# Patient Record
Sex: Female | Born: 1955 | Race: White | Hispanic: No | Marital: Married | Smoking: Never smoker
Health system: Southern US, Community
[De-identification: ages and names within clinical notes are randomized; demographics above are authoritative.]

## PROBLEM LIST (undated history)

## (undated) DIAGNOSIS — M1990 Unspecified osteoarthritis, unspecified site: Secondary | ICD-10-CM

## (undated) DIAGNOSIS — E559 Vitamin D deficiency, unspecified: Secondary | ICD-10-CM

## (undated) DIAGNOSIS — M7660 Achilles tendinitis, unspecified leg: Secondary | ICD-10-CM

## (undated) DIAGNOSIS — E785 Hyperlipidemia, unspecified: Secondary | ICD-10-CM

## (undated) DIAGNOSIS — G43909 Migraine, unspecified, not intractable, without status migrainosus: Secondary | ICD-10-CM

## (undated) DIAGNOSIS — I10 Essential (primary) hypertension: Secondary | ICD-10-CM

## (undated) HISTORY — PX: LUMBAR SPINE SURGERY: SHX701

---

## 1998-05-14 ENCOUNTER — Ambulatory Visit (HOSPITAL_COMMUNITY): Admission: RE | Admit: 1998-05-14 | Discharge: 1998-05-14 | Payer: Self-pay | Admitting: *Deleted

## 1998-12-25 ENCOUNTER — Other Ambulatory Visit: Admission: RE | Admit: 1998-12-25 | Discharge: 1998-12-25 | Payer: Self-pay | Admitting: *Deleted

## 1999-02-28 ENCOUNTER — Encounter (INDEPENDENT_AMBULATORY_CARE_PROVIDER_SITE_OTHER): Payer: Self-pay

## 1999-02-28 ENCOUNTER — Other Ambulatory Visit: Admission: RE | Admit: 1999-02-28 | Discharge: 1999-02-28 | Payer: Self-pay | Admitting: *Deleted

## 2000-02-05 ENCOUNTER — Other Ambulatory Visit: Admission: RE | Admit: 2000-02-05 | Discharge: 2000-02-05 | Payer: Self-pay | Admitting: *Deleted

## 2000-08-13 ENCOUNTER — Emergency Department (HOSPITAL_COMMUNITY): Admission: EM | Admit: 2000-08-13 | Discharge: 2000-08-13 | Payer: Self-pay | Admitting: Emergency Medicine

## 2001-12-13 ENCOUNTER — Encounter: Payer: Self-pay | Admitting: *Deleted

## 2001-12-13 ENCOUNTER — Ambulatory Visit (HOSPITAL_COMMUNITY): Admission: RE | Admit: 2001-12-13 | Discharge: 2001-12-13 | Payer: Self-pay | Admitting: *Deleted

## 2002-03-20 ENCOUNTER — Other Ambulatory Visit: Admission: RE | Admit: 2002-03-20 | Discharge: 2002-03-20 | Payer: Self-pay | Admitting: *Deleted

## 2002-12-26 ENCOUNTER — Ambulatory Visit (HOSPITAL_COMMUNITY): Admission: RE | Admit: 2002-12-26 | Discharge: 2002-12-26 | Payer: Self-pay | Admitting: *Deleted

## 2002-12-26 ENCOUNTER — Encounter: Payer: Self-pay | Admitting: *Deleted

## 2003-05-28 ENCOUNTER — Other Ambulatory Visit: Admission: RE | Admit: 2003-05-28 | Discharge: 2003-05-28 | Payer: Self-pay | Admitting: *Deleted

## 2005-07-01 ENCOUNTER — Emergency Department (HOSPITAL_COMMUNITY): Admission: EM | Admit: 2005-07-01 | Discharge: 2005-07-01 | Payer: Self-pay | Admitting: Emergency Medicine

## 2005-09-14 ENCOUNTER — Emergency Department (HOSPITAL_COMMUNITY): Admission: EM | Admit: 2005-09-14 | Discharge: 2005-09-14 | Payer: Self-pay | Admitting: Family Medicine

## 2005-10-01 ENCOUNTER — Encounter: Admission: RE | Admit: 2005-10-01 | Discharge: 2005-10-01 | Payer: Self-pay | Admitting: *Deleted

## 2005-11-28 ENCOUNTER — Emergency Department (HOSPITAL_COMMUNITY): Admission: EM | Admit: 2005-11-28 | Discharge: 2005-11-28 | Payer: Self-pay | Admitting: Family Medicine

## 2006-03-24 ENCOUNTER — Other Ambulatory Visit: Admission: RE | Admit: 2006-03-24 | Discharge: 2006-03-24 | Payer: Self-pay | Admitting: *Deleted

## 2006-09-26 ENCOUNTER — Inpatient Hospital Stay (HOSPITAL_COMMUNITY): Admission: AD | Admit: 2006-09-26 | Discharge: 2006-09-26 | Payer: Self-pay | Admitting: *Deleted

## 2006-12-15 IMAGING — CT CT ANGIO CHEST
2 of 4 series · 19 of 36 positions shown · IV contrast (APPLIED)
Comparison: None.

CLINICAL DATA: Chest pain and shortness of breath.

CT ANGIOGRAPHY OF CHEST - PULMONARY EMBOLISM PROTOCOL
TECHNIQUE: Multidetector CT imaging of the chest was performed according to the
protocol for detection of pulmonary embolism during bolus injection of
intravenous contrast.  Coronal and sagittal plane CT angiographic image
reconstructions were also generated.
Contrast:  80 cc Omnipaque 300

[Series 5: pe 1.0 b20f st · axial · 0.63mm/px · z∈[-265,-41]mm · 16 of 250 slices shown]
[im 13/250  lung]
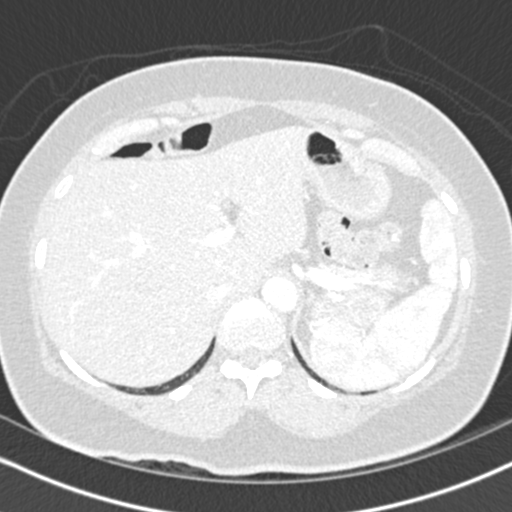
[im 25/250  mediastinal]
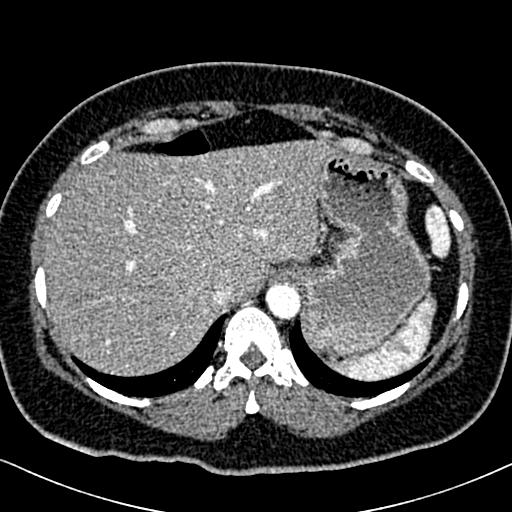
[im 38/250  lung]
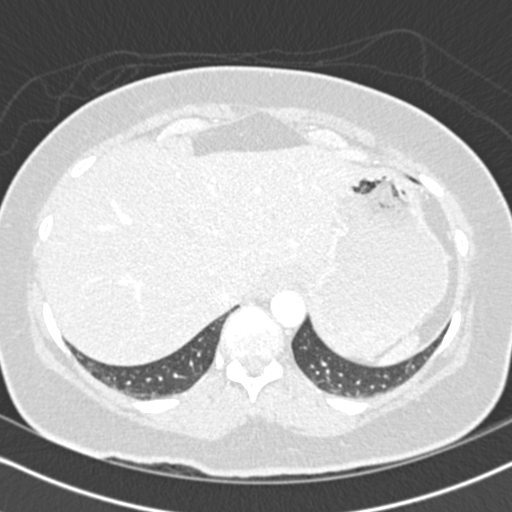
[im 63/250  mediastinal]
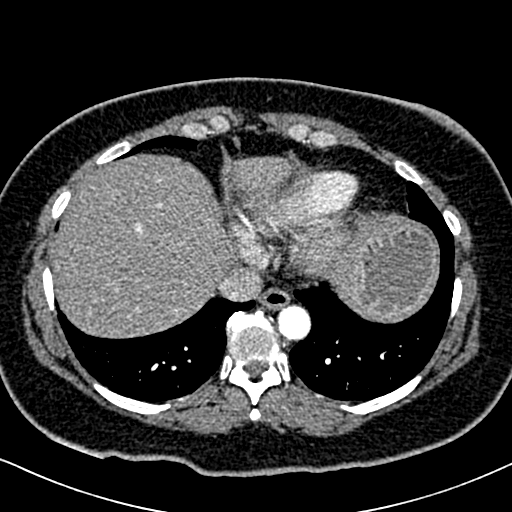
[im 75/250  lung]
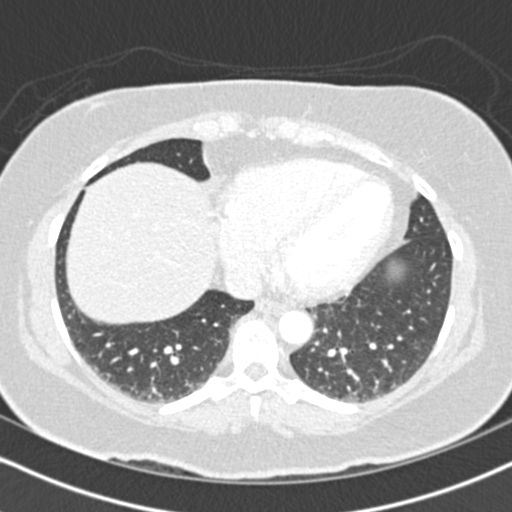
[im 88/250  mediastinal]
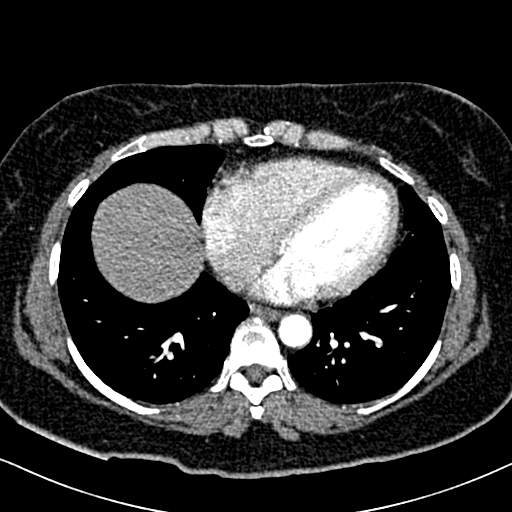
[im 100/250  lung]
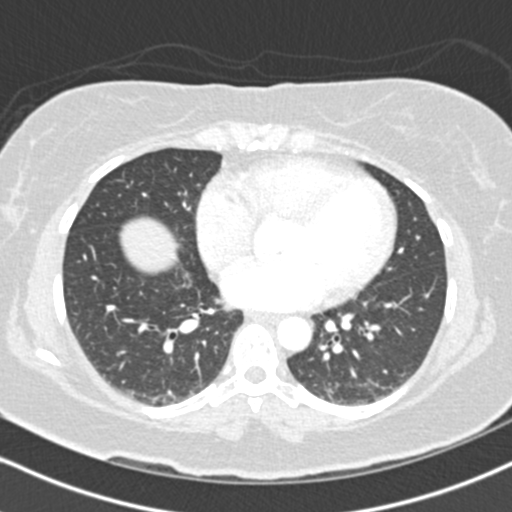
[im 113/250  mediastinal]
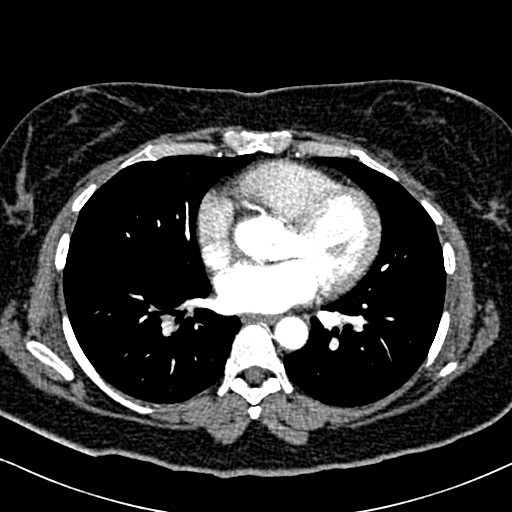
[im 137/250  lung]
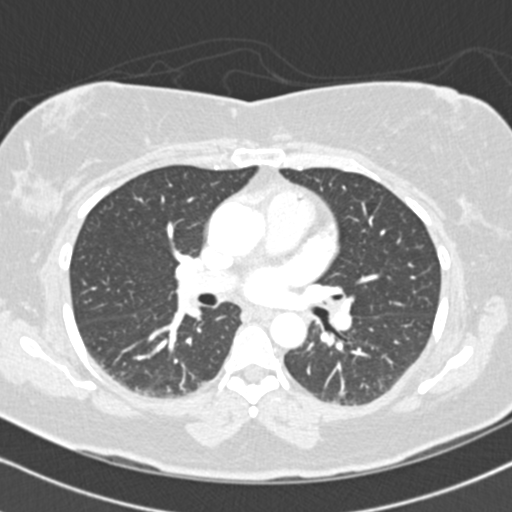
[im 150/250  mediastinal]
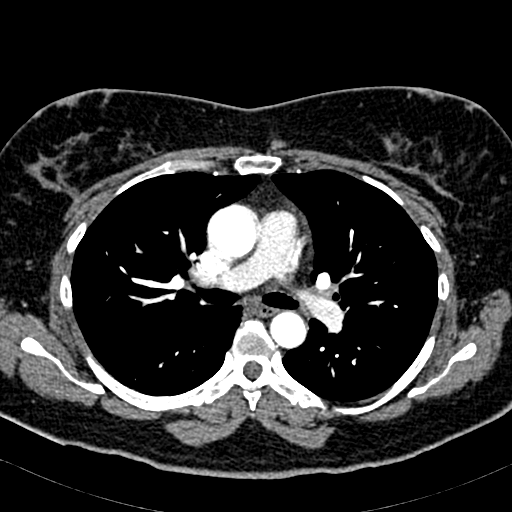
[im 162/250  lung]
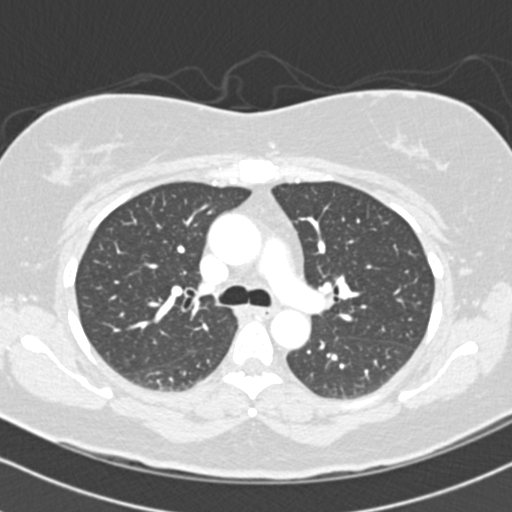
[im 175/250  mediastinal]
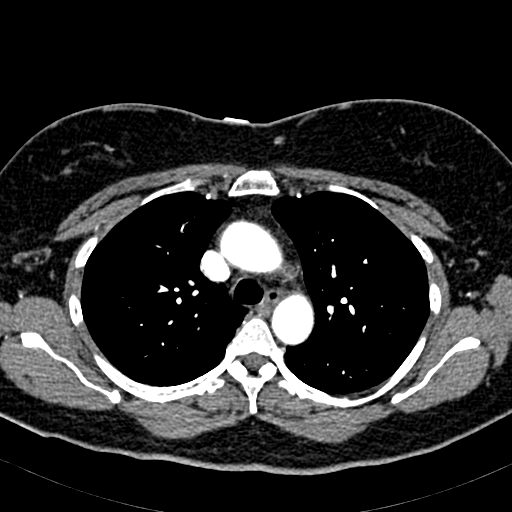
[im 187/250  lung]
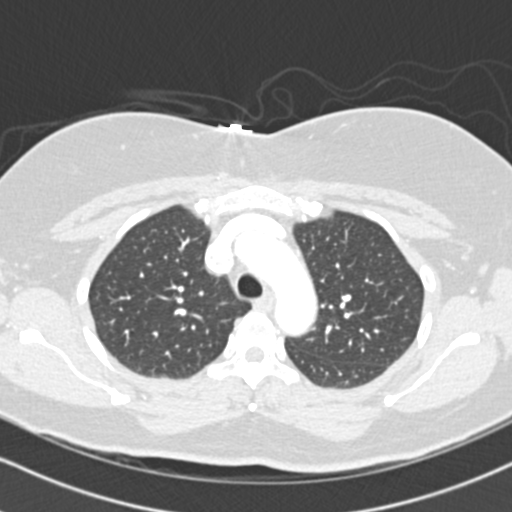
[im 212/250  mediastinal]
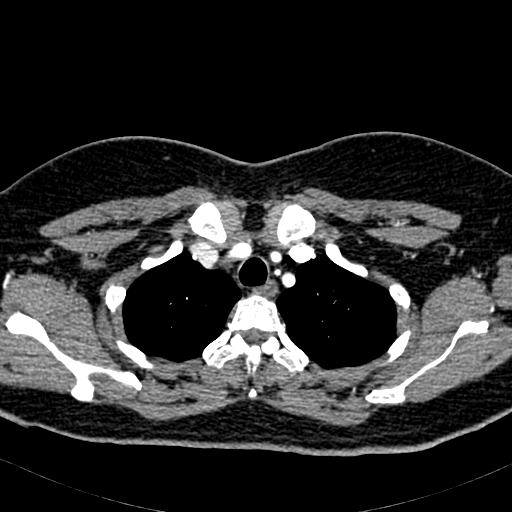
[im 225/250  lung]
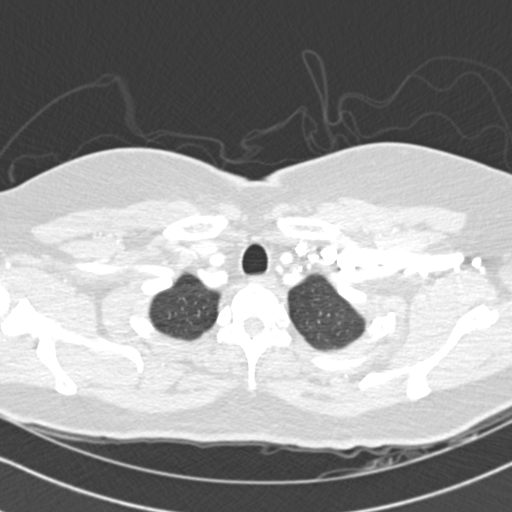
[im 237/250  mediastinal]
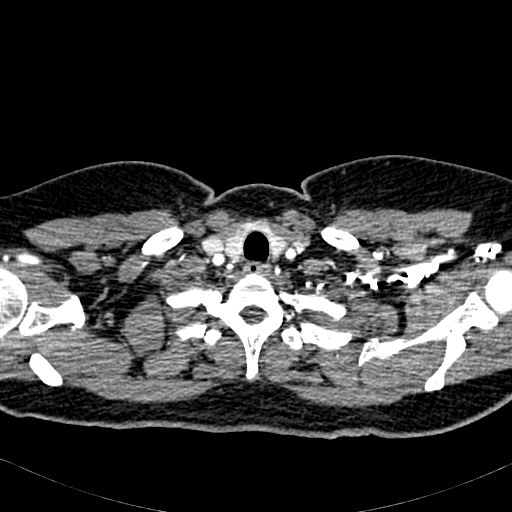

[Series 602: coronal · coronal · 0.63mm/px · 3 of 39 slices shown]
[im 8/39  mediastinal]
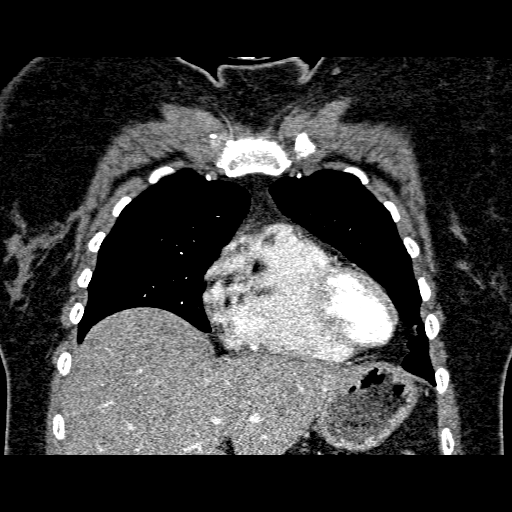
[im 16/39  mediastinal]
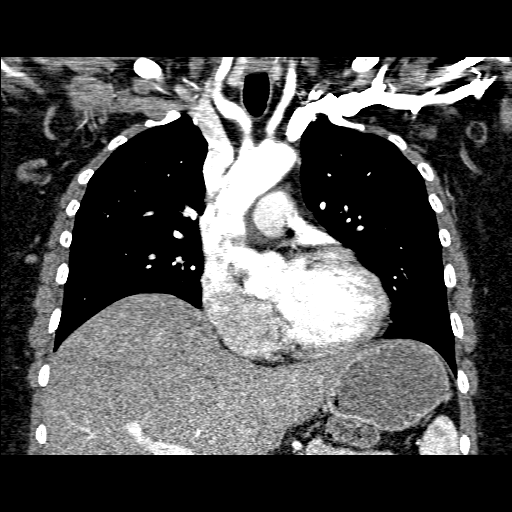
[im 23/39  mediastinal]
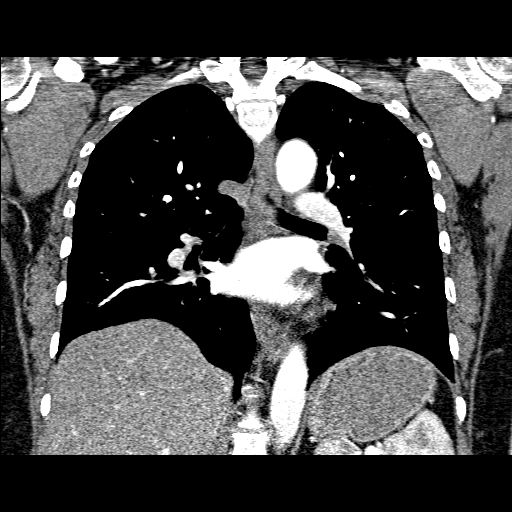

[19 of 36 positions shown; findings below may reference images not displayed]

FINDINGS: Suboptimally opacified pulmonary arteries with no pulmonary arterial
filling defects seen. Clear lungs. No enlarged lymph nodes. No pneumothorax or
pleural fluid. Lower thoracic spine degenerative changes.

IMPRESSION

No acute abnormality. No pulmonary emboli seen.

## 2007-04-20 ENCOUNTER — Other Ambulatory Visit: Admission: RE | Admit: 2007-04-20 | Discharge: 2007-04-20 | Payer: Self-pay | Admitting: *Deleted

## 2007-05-18 ENCOUNTER — Emergency Department (HOSPITAL_COMMUNITY): Admission: EM | Admit: 2007-05-18 | Discharge: 2007-05-18 | Payer: Self-pay | Admitting: Emergency Medicine

## 2007-05-31 ENCOUNTER — Emergency Department (HOSPITAL_COMMUNITY): Admission: EM | Admit: 2007-05-31 | Discharge: 2007-05-31 | Payer: Self-pay | Admitting: Emergency Medicine

## 2007-07-11 ENCOUNTER — Other Ambulatory Visit: Admission: RE | Admit: 2007-07-11 | Discharge: 2007-07-11 | Payer: Self-pay | Admitting: *Deleted

## 2008-01-12 ENCOUNTER — Other Ambulatory Visit: Admission: RE | Admit: 2008-01-12 | Discharge: 2008-01-12 | Payer: Self-pay | Admitting: Gynecology

## 2008-05-29 ENCOUNTER — Ambulatory Visit (HOSPITAL_COMMUNITY): Admission: RE | Admit: 2008-05-29 | Discharge: 2008-05-29 | Payer: Self-pay | Admitting: Gynecology

## 2008-06-01 ENCOUNTER — Other Ambulatory Visit: Admission: RE | Admit: 2008-06-01 | Discharge: 2008-06-01 | Payer: Self-pay | Admitting: Gynecology

## 2008-11-15 ENCOUNTER — Emergency Department (HOSPITAL_COMMUNITY): Admission: EM | Admit: 2008-11-15 | Discharge: 2008-11-15 | Payer: Self-pay | Admitting: Family Medicine

## 2010-01-28 ENCOUNTER — Ambulatory Visit: Payer: Self-pay | Admitting: Family Medicine

## 2010-01-28 DIAGNOSIS — M722 Plantar fascial fibromatosis: Secondary | ICD-10-CM | POA: Insufficient documentation

## 2010-01-28 DIAGNOSIS — M7660 Achilles tendinitis, unspecified leg: Secondary | ICD-10-CM | POA: Insufficient documentation

## 2010-01-28 DIAGNOSIS — E785 Hyperlipidemia, unspecified: Secondary | ICD-10-CM | POA: Insufficient documentation

## 2010-01-28 DIAGNOSIS — I10 Essential (primary) hypertension: Secondary | ICD-10-CM | POA: Insufficient documentation

## 2010-04-02 ENCOUNTER — Encounter: Admission: RE | Admit: 2010-04-02 | Discharge: 2010-05-12 | Payer: Self-pay | Admitting: Emergency Medicine

## 2010-04-18 ENCOUNTER — Encounter: Payer: Self-pay | Admitting: Emergency Medicine

## 2010-04-23 ENCOUNTER — Encounter: Admission: RE | Admit: 2010-04-23 | Discharge: 2010-04-23 | Payer: Self-pay | Admitting: Legal Medicine

## 2010-05-08 ENCOUNTER — Ambulatory Visit: Payer: Self-pay | Admitting: Sports Medicine

## 2010-05-08 DIAGNOSIS — M79609 Pain in unspecified limb: Secondary | ICD-10-CM | POA: Insufficient documentation

## 2010-06-05 ENCOUNTER — Ambulatory Visit: Payer: Self-pay | Admitting: Sports Medicine

## 2010-06-05 DIAGNOSIS — M2550 Pain in unspecified joint: Secondary | ICD-10-CM | POA: Insufficient documentation

## 2010-06-11 ENCOUNTER — Encounter: Payer: Self-pay | Admitting: Sports Medicine

## 2010-06-13 ENCOUNTER — Encounter: Payer: Self-pay | Admitting: Emergency Medicine

## 2010-07-09 ENCOUNTER — Encounter: Payer: Self-pay | Admitting: Family Medicine

## 2010-07-09 ENCOUNTER — Ambulatory Visit: Payer: Self-pay | Admitting: Sports Medicine

## 2010-07-23 ENCOUNTER — Ambulatory Visit: Payer: Self-pay | Admitting: Family Medicine

## 2010-08-31 ENCOUNTER — Encounter: Payer: Self-pay | Admitting: *Deleted

## 2010-08-31 ENCOUNTER — Encounter: Payer: Self-pay | Admitting: Legal Medicine

## 2010-09-11 NOTE — Assessment & Plan Note (Signed)
Summary: F/U ARCH,MC   Vital Signs:  Patient profile:   55 year old female BP sitting:   138 / 73  Vitals Entered By: Lillia Pauls CMA (June 05, 2010 9:37 AM)  History of Present Illness: 55yo female to office for f/u b/l heel pain. Pain relatively unchanged from last visit, predominantly in the achilles, but also along PF. Symptoms worse on the right. Has tried using sports insoles with heel lifts, but do not seem to help.  Feels most comfortable when wearing her Dansco's at work. Cont. to wear arch strap on right which does help. Apply ice periodically. Doing heel raise exercises & other exercises 1-2 times/daily. Completed formal physical therapy several weeks ago. Not taking anything for the pain at this time. Feels she is limping at work.  Also c/o achiness & occasional swelling in her hands, wrists, and other joints.  Recalls having similar symptoms in the past while taking simvastatin, this was stopped & symptoms improved.  She is now taking Crestor & has been on this for over 2-years. No known Family Hx of RA, althought mother passed away when pt was young.  Pt's PCP has done blood work & sent her to a rheumatologist for concerns of RA.  Rheumatologist did not find anything per patient.  Allergies: No Known Drug Allergies  Review of Systems      See HPI  Physical Exam  General:  Well-developed,well-nourished,in no acute distress; alert,appropriate and cooperative throughout examination Msk:  ANKLES:  full ROM b/l without pain.  no swelling, bruising, erythema.  Normal ankle strength b/l.  TTP along achilles b/l, no palpable nodules or step-offs.  Normal Thompson test b/l.  Able to do heel raises.  Foot: On standing, flattening of transverse arch R>L with mild flattening of longitudinal arch bilaterally.  No significant callous formation.  GAIT:  walks with right foot externally rotated, seems to favor right leg.  Does have some dynamic pronation of both feet as  well.  UPPER EXT:  normal ROM of hands, wrists, elbows.  No TTP over hands, wrists, elbows b/l.  No swelling or erythema.  Normal upper ext strength b/l. Pulses:  +2/4 radial, DP, PT b/l Neurologic:  sensation intact to light touch.   Additional Exam:  MSK U/S:  bilateraly achilles width normal, no increased doppler flow, no signs of inflammation.  Normal appearing PF bilaterally with normal width, no signs of inflammation or spurring.  No increased dopper flow.  Images saved.   Impression & Recommendations:  Problem # 1:  FOOT PAIN, BILATERAL (ICD-729.5) - Normal appearing achilles & PF on u/s today without any signs of inflammation.  Question whether pain related to her foot breakdown vs possible side effect from Crestor vs other process such as RA or other rheumatologic disorder - Recommend she stop her Crestor for the next month - Lab slip given for CBC, ESR, RF, ANA - plans to get this done where she works - Cont. to wear sports insoles & good supportive shoes.  Could consider custom orthotics in the future if symptoms persist & other workup negative. - Cont. exercises - f/u in 36-month for re-evaluation  Future Orders: CBC-[facility] ([id number]) ... 05/18/2012  Problem # 2:  ARTHRALGIA (ICD-719.40) - Multiple arthralgias of upper & lower extremities. - Stop Crestor as stated above since had similar symptoms in past with simvastatin - Check blood work as stated above to evaluate for rheumatologic process  Orders: [place] LIMITED ([id number])Future Orders: CBC-[facility] ([id number]) ... 05/18/2012 ANA-[facility] ([id number]) .Marland KitchenMarland Kitchen  05/20/2011 Rheum Fact-[facility] (95638) ... 05/12/2011 Sed Rate (ESR)-[facility] [phone]) ... 05/11/2011  Problem # 3:  HAND PAIN, BILATERAL (ICD-729.5) - Possible medication side effect vs rheumatologic process - Stop Crestor as stated above  - Check blood work as stated above   Orders: [place] LIMITED ([id number])Future Orders: CBC-[facility] (32951) ... 05/18/2012 ANA-[facility] ([id number]) ... 05/20/2011 Rheum  Fact-[facility] (16010) ... 05/12/2011 Sed Rate (ESR)-[facility] [phone]) ... 05/11/2011  Complete Medication List: 1)  Cymbalta 60 Mg Cpep (Duloxetine hcl) .... Once daily 2)  Topamax 50 Mg Tabs (Topiramate) .... Once daily 3)  Xanax 1 Mg Tabs (Alprazolam) .... Bid 4)  Crestor 10 Mg Tabs (Rosuvastatin calcium) .... Once daily 5)  Phentermine Hcl 37.5 Mg Caps (Phentermine hcl) .... Once daily 6)  Aspirin 81 Mg Tbec (Aspirin) .... Once daily 7)  Multivitamins Tabs (Multiple vitamin) .... Once daily  Patient Instructions: 1)  You had a normal ultrasound of your plantar fascia & achilles today. 2)  Try stopping your crestor for 42-month & see if this helps since had similar effects from simvastatin in past. 3)  Will check blood work - CBC, ESR, ANA, RF to evaluate for possibility of rheumatoid or other disease process. 4)  Wear good supportive shoes, wear arch straps as needed. 5)  f/u in 65-month.   Orders Added: 1)  CBC-[facility] [[id number]] 2)  ANA-[facility] [[id number]] 3)  Rheum Fact-[facility] [86430] 4)  Sed Rate (ESR)-[facility] [85651] 5)  Est. Patient Level IV [70623] 6)  [place] LIMITED [[id number]]  Appended Document: F/U ARCH,MC Spoke to pt on phone 06/11/10 @ 13:40 regarding recent blood test results from 06/05/10.  Informed her CBC, ESR, ANA normal, but had significantly elevated RF = 3140. She continues to have pain in her feet, as well as hands.  Will check additional labs including: Anti-ccp to further evaluate significance of elevated RF.  New lab ordered faxed to [facility] @ [phone] for patient, she will get blood drawn there.  Will contact her with results & discuss further tx & f/u at that time.  Pt expressed understanding & agreement with above.  Appended Document: F/U ARCH,MC Spoke to pt on phone 06/13/10.  Follow-up blood work with anti-CCP was in the normal ranges.  At this time suspect she has some rheumatologic process, but does not seem to be rheumatoid arthritis at this time.  No further blood work  at this time.  Will try to determine best treatment plan & get in touch with her next week.  Pt expressed understanding & agreement with above & thanked me for the call.  she can be reached at work [phone] or at home phone number on chart.

## 2010-09-11 NOTE — Letter (Signed)
Summary: *Consult Note  [facility]  [address]   [place], [place] [address]   Phone: [phone]  Fax: [phone]    Re:    Sabrina Thomas DOB:    13-Sep-1955   Dear: Ahmed Prima   Thank you for requesting that we see the above patient for consultation.  A copy of the detailed office note will be sent under separate cover, for your review.  Evaluation today is consistent with: resolved foot/heel pain.  Given history of multiple other joints involved with pain over time and her rheum factor of 3100, she likely does have some developing RA.  You may not have requested this consult, we are merely providing this to you for your information.   Our recommendation is for: Mobic 7.5 mg once daily to two times a day.  Also do trial of vitamin C daily and fish oil 1500 mg daily.  encouraged to stay as active as possible.  Because she is improved off crestor, would recommend she stay off of it at this time and try other dietary changes for her cholesterol.  See [place] back in 6 weeks.  New Medications started today include: mobic 7.5 mg 1-2 times daily   After today's visit, the patients current medications include: 1)  CYMBALTA 60 MG CPEP (DULOXETINE HCL) once daily 2)  TOPAMAX 50 MG TABS (TOPIRAMATE) once daily 3)  XANAX 1 MG TABS (ALPRAZOLAM) bid 4)  CRESTOR 10 MG TABS (ROSUVASTATIN CALCIUM) once daily 5)  PHENTERMINE HCL 37.5 MG CAPS (PHENTERMINE HCL) once daily 6)  ASPIRIN 81 MG TBEC (ASPIRIN) once daily 7)  MULTIVITAMINS  TABS (MULTIPLE VITAMIN) once daily 8)  MELOXICAM 7.5 MG TABS (MELOXICAM) take 1-2 times daily   Thank you for this consultation.  If you have any further questions regarding the care of this patient, please do not hesitate to contact me @ [phone]  Thank you for this opportunity to look after your patient.  Sincerely,  Corbin Ade MD Sports Medicine Fellow  [facility] Director

## 2010-09-11 NOTE — Letter (Signed)
Summary: PHYSICAL THERAPY PROGRESS NOTE  PHYSICAL THERAPY PROGRESS NOTE   Imported By: Dannette Barbara 04/18/2010 17:07:58  _____________________________________________________________________  External Attachment:    Type:   Image     Comment:   External Document

## 2010-09-11 NOTE — Assessment & Plan Note (Signed)
Summary: NP,ACHILLIES TENDON,MC   Vital Signs:  Patient profile:   55 year old female Height:      64 inches Weight:      160 pounds BMI:     27.56 BP sitting:   142 / 89  Vitals Entered By: Lillia Pauls CMA (May 08, 2010 10:09 AM)  History of Present Illness: 55 yo woman with a 3 month history of heel and foot pain. Patient moved in April and increased her activity, particularly going up and down stairs over a six week period. In June, she was getting out of her car and found that she could not walk because of pain in both feet and achilles.  She went to urgent care and was advised to take Alieve and start stretching. She saw Dr. Orson Aloe who prescribed physical therapy, and Dr. Lajoyce Corners who gave her one cortisone injection in the plantar surface of each heel. The injections helped with pain relief for about a week. At follow up, Dr. Lajoyce Corners suggested that if she was not improving she might need surgery. She came here for a second opinion regarding conservative management options.  In the office today, her pain and mobility are improved and she is able to walk, though still with some discomfort. She has been wearing an aircast with achilles compression on her R leg, which causes her more pain.  Allergies (verified): No Known Drug Allergies  Physical Exam  General:  Well-developed,well-nourished,in no acute distress; alert,appropriate and cooperative throughout examination Msk:  Ankle: Symmetric appearance of ankles without edema, erythema. No pain to palpation along achilles or heels.  Foot: On standing, flattening of transverse arch R>L with some evidence of flattening of longitudinal arch bilaterally. Flexion and extension of great toe on R reveals decreased extension to 30 degrees and flexion to 10 degrees. Flexion and extension of great toe on L reveals decreased extension to 45 degrees and flexion to 15 degrees.  Ultrasound: Bilateral achilles width WNL. Bilateral plantar fascia  width WNL. Great toe with bone spur in MTP joint.  There are no tears noted in either tissue. No abnormal doppler activity.    Impression & Recommendations:  Problem # 1:  FOOT PAIN, BILATERAL (ICD-729.5)  Bilateral foot pain likely secondary to increased physical activity and foot breakdown given normal width of plantar fascia on ultrasound. Achilles also wnl. Patient instructed to continue physical therapy and daily icing. Patient tried temporary insoles with heel lifts bilaterally, arch support on R. Reported improved pain in office. She will return in 4 weeks for custom orthotics.   do eccentric exercise protocol  Orders: [place] LIMITED ([id number])  Complete Medication List: 1)  Cymbalta 60 Mg Cpep (Duloxetine hcl) .... Once daily 2)  Topamax 50 Mg Tabs (Topiramate) .... Once daily 3)  Xanax 1 Mg Tabs (Alprazolam) .... Bid 4)  Crestor 10 Mg Tabs (Rosuvastatin calcium) .... Once daily 5)  Phentermine Hcl 37.5 Mg Caps (Phentermine hcl) .... Once daily 6)  Aspirin 81 Mg Tbec (Aspirin) .... Once daily 7)  Multivitamins Tabs (Multiple vitamin) .... Once daily

## 2010-09-11 NOTE — Assessment & Plan Note (Signed)
Summary: FEET PAIN   Vital Signs:  Patient Profile:   55 Years Old Female CC:      bilateral foot pain X 3 weeks Height:     63 inches Weight:      169 pounds O2 Sat:      96 % O2 treatment:    Room Air Temp:     98.9 degrees F oral Pulse rate:   86 / minute Resp:     12 per minute BP sitting:   141 / 91  (right arm) Cuff size:   regular  Pt. in pain?   yes    Location:   bilateral feet    Intensity:   10    Type:       heaviness/dull  Vitals Entered By: Lajean Saver RN (January 28, 2010 3:37 PM)                   Updated Prior Medication List: CYMBALTA 60 MG CPEP (DULOXETINE HCL) once daily TOPAMAX 50 MG TABS (TOPIRAMATE) once daily XANAX 1 MG TABS (ALPRAZOLAM) bid CRESTOR 10 MG TABS (ROSUVASTATIN CALCIUM) once daily PHENTERMINE HCL 37.5 MG CAPS (PHENTERMINE HCL) once daily ASPIRIN 81 MG TBEC (ASPIRIN) once daily MULTIVITAMINS  TABS (MULTIPLE VITAMIN) once daily  Current Allergies: No known allergies History of Present Illness Chief Complaint: bilateral foot pain X 3 weeks History of Present Illness: Subjective:  Patient complains of bilateral foot and heel pain for several weeks, now worse for 2 to 3 days.  Recalls no recent trauma or significant change in physical activities.  She tends to wear flat sandals.    REVIEW OF SYSTEMS Constitutional Symptoms      Denies fever, chills, night sweats, weight loss, weight gain, and fatigue.  Eyes       Denies change in vision, eye pain, eye discharge, glasses, contact lenses, and eye surgery. Ear/Nose/Throat/Mouth       Denies hearing loss/aids, change in hearing, ear pain, ear discharge, dizziness, frequent runny nose, frequent nose bleeds, sinus problems, sore throat, hoarseness, and tooth pain or bleeding.  Respiratory       Denies dry cough, productive cough, wheezing, shortness of breath, asthma, bronchitis, and emphysema/COPD.  Cardiovascular       Denies murmurs, chest pain, and tires easily with exhertion.     Gastrointestinal       Denies stomach pain, nausea/vomiting, diarrhea, constipation, blood in bowel movements, and indigestion. Genitourniary       Denies painful urination, kidney stones, and loss of urinary control. Neurological       Denies paralysis, seizures, and fainting/blackouts. Musculoskeletal       Complains of muscle pain and joint pain.      Denies joint stiffness, decreased range of motion, redness, swelling, muscle weakness, and gout.      Comments: feet Skin       Denies bruising, unusual mles/lumps or sores, and hair/skin or nail changes.  Psych       Denies mood changes, temper/anger issues, anxiety/stress, speech problems, depression, and sleep problems. Other Comments: intermittent foot pain X 3 weeks, very painful to walk   Past History:  Past Medical History: Hyperlipidemia Hypertension- past  Past Surgical History: Tubal ligation L4 ruputre disc repair  Family History: NA  Social History: Occupation: front office [employer] Never Smoked Alcohol use-no Drug use-no Regular exercise-yes Smoking Status:  never Drug Use:  no Does Patient Exercise:  yes   Objective:  Left foot:  No swelling, erythema,  or warmth.  Mild tenderness in forefoot.  Tenderness over plantar surface towards heel.  Distinct tenderness over insertion of achilles tendon at heel.  Distal neurovascular intact  Right foot:  No swelling, erythema, or warmth.  Mild tenderness in forefoot.  Tenderness over plantar surface at heel.  Distal neurovascular intact  Assessment New Problems: PLANTAR FASCIITIS, BILATERAL (ICD-728.71) ACHILLES TENDINITIS (ICD-726.71) HYPERTENSION (ICD-401.9) HYPERLIPIDEMIA (ICD-272.4)   Plan New Orders: New Patient Level III [99203] Planning Comments:   Begin NSAID for several days.  Begin using well-fitting athletic shoes, possibly with a custom insert such as "SuperFeet." Begin stretching exercises ([employer] information and instruction patient  handout given)  Follow-up with [facility] if not improving two to three weeks.   The patient and/or caregiver has been counseled thoroughly with regard to medications prescribed including dosage, schedule, interactions, rationale for use, and possible side effects and they verbalize understanding.  Diagnoses and expected course of recovery discussed and will return if not improved as expected or if the condition worsens. Patient and/or caregiver verbalized understanding.   Orders Added: 1)  New Patient Level III [[address]]

## 2010-09-11 NOTE — Assessment & Plan Note (Signed)
Summary: f/u,mc   Vital Signs:  Patient profile:   55 year old female BP sitting:   131 / 82  Vitals Entered By: Lillia Pauls CMA (July 09, 2010 9:33 AM)  History of Present Illness: pt here today to fu her B heel pain, which she sts is about 80% better with the exercises. the pain didnt really subside until about 10days ago. was able to stand up for "5 thanksgiving dinners" without any pain.  Prior to this had horrible 2 week time period. Not really using her green insoles.  Only feels good in her danskos.  No longer has 1st step pain when getting out of bed.  Only occasionally has bad pain with 1st moving now. Has stopped Crestor since last visit. In past has had pain in b/l wrists, MCPs, neck, shoulders, hips, and feet/toes, in addition to her heels.  However, at present is basically pain free. Unclear if family hx of RA or other rheum diseases. Lab review showed RF of 3100, otherwise neg.  Anti-CCP nl. Denies h/o IVDA.  Denies possibility of chronic HBV or HCV. Prev saw rheum doctor who did not thinks she had RA.  Allergies: No Known Drug Allergies  Physical Exam  General:  Well-developed,well-nourished,in no acute distress; alert,appropriate and cooperative throughout examination Head:  normocephalic.   Neck:  supple.   Lungs:  normal respiratory effort.   Abdomen:  soft.   Msk:  Hands: some mild swelling/synovitis of b/l diffuse MCP joints.  No ulnar deviation yet of wrist or PIPs. + squaring of b/l CMC joints.  Feet: no ttp over AT or PF insertion b/l.  No RC bursitis.  Nl foot ROM.  Neck: supple, FROM Neurologic:  alert & oriented X3.     Impression & Recommendations:  Problem # 1:  FOOT PAIN, BILATERAL (ICD-729.5) At this point, greatly improved.  Given h/o other complaints, more likely from systemic disease process than focal PF or AT. - continue Danskos and heel stretches - f/u 6 weeks - trial of NSAID as below  Problem # 2:  ARTHRALGIA  (ICD-719.40) Essentially pain free every where.  however, with labwork concerning that she does have a developing rheum disease. - see pt instructions - trial of NSAID, vit C, and fish oil - symptom diary - f/u 6 weeks  Complete Medication List: 1)  Cymbalta 60 Mg Cpep (Duloxetine hcl) .... Once daily 2)  Topamax 50 Mg Tabs (Topiramate) .... Once daily 3)  Xanax 1 Mg Tabs (Alprazolam) .... Bid 4)  Crestor 10 Mg Tabs (Rosuvastatin calcium) .... Once daily 5)  Phentermine Hcl 37.5 Mg Caps (Phentermine hcl) .... Once daily 6)  Aspirin 81 Mg Tbec (Aspirin) .... Once daily 7)  Multivitamins Tabs (Multiple vitamin) .... Once daily 8)  Meloxicam 7.5 Mg Tabs (Meloxicam) .... Take 1-2 times daily  Patient Instructions: 1)  Keep log of your pains in a diary. 2)  Try the meloxicam 7.5 mg daily, and ok to increase to twice daily if helpful with pain. 3)  Follow up in 6 weeks. 4)  Continue vitamin C daily. 5)  Try fish oil 1500 mg daily. 6)  I would recommend staying off the crestor. 7)  Try eating soy products and almonds for your cholesterol. Prescriptions: MELOXICAM 7.5 MG TABS (MELOXICAM) take 1-2 times daily  #60 x 1   Entered and Authorized by:   Corbin Ade MD   Signed by:   Corbin Ade MD on 07/09/2010   Method used:   Electronically  to        [facility] (retail)       [address]       [place], [place]  [address]       Ph: [phone]       Fax: [phone]   RxID:   [id number]    Orders Added: 1)  Est. Patient Level III [44010]

## 2010-09-23 ENCOUNTER — Encounter: Payer: Self-pay | Admitting: Sports Medicine

## 2010-09-23 ENCOUNTER — Ambulatory Visit (INDEPENDENT_AMBULATORY_CARE_PROVIDER_SITE_OTHER): Payer: 59 | Admitting: Sports Medicine

## 2010-09-23 DIAGNOSIS — M2550 Pain in unspecified joint: Secondary | ICD-10-CM

## 2010-10-01 NOTE — Assessment & Plan Note (Signed)
Summary: F/U,MC   Vital Signs:  Patient profile:   55 year old female BP sitting:   141 / 89  Vitals Entered By: Lillia Pauls CMA (September 23, 2010 9:33 AM)  History of Present Illness: Now has pain in heels only if she overdoes activity no other joints meloxicam - maybe some GI effects but PCP thinks bleeding is from int hemmrhoid   danskos help nothing else has made much difference x not walking barefoot  no real evidence that the crestor or other meds have affected this. 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 Once daily 2)  Topamax 50 Mg Tabs (Topiramate) .... Once daily 3)  Xanax 1 Mg Tabs (Alprazolam) .... Bid 4)  Crestor 10 Mg Tabs (Rosuvastatin calcium) .... Once daily 5)  Aspirin 81 Mg Tbec (Aspirin) .... Once daily 6)  Multivitamins Tabs (Multiple vitamin) .... Once daily 7)  Meloxicam 7.5 Mg Tabs (Meloxicam) .... Take 1-2 times daily  Patient Instructions: 1)  Trial on meloxicam every other day 2)  If OK you can even shift to every third day after 1 to 2 months 3)  OK to try exercise bike 4)  OK to walk but keep time  at 30 mins and the frequency every other day 5)  barefoot will trigger pain in achilles so use some heel support 6)  check with me in 6 months Prescriptions: MELOXICAM 7.5 MG TABS (MELOXICAM) take 1-2 times daily  #180 x 3   Entered and Authorized by:   Enid Baas MD   Signed by:   Enid Baas MD on 09/23/2010   Method used:   Electronically to        [facility]* (retail)       [address]       [address] Shipping/mailing       [place], [place]  [address]       Ph: [phone]       Fax: [phone]   RxID:   [id number]    Orders Added: 1)  Est. Patient Level III [84132]

## 2010-10-07 ENCOUNTER — Telehealth (INDEPENDENT_AMBULATORY_CARE_PROVIDER_SITE_OTHER): Payer: Self-pay | Admitting: *Deleted

## 2010-10-16 NOTE — Progress Notes (Signed)
  Pt states she started crestor back 1 week ago- started having joint pain in hips, knees, shoulders, hands 4 days ago.  States pain is 8 out of 10.  Per Dr. Darrick Penna- stop crestor- take short burst of prednisone called to [facility] [place]- [phone],   ---- Converted from flag ---- ---- 10/07/2010 2:05 PM, Marily Memos wrote: Pt called regarding the pain she is in. her contact # is [phone] ------------------------------       New/Updated Medications: PREDNISONE 20 MG TABS (PREDNISONE) take 1 by mouth two times a day Prescriptions: PREDNISONE 20 MG TABS (PREDNISONE) take 1 by mouth two times a day  #20 x 0   Entered by:   Rochele Pages RN   Authorized by:   Enid Baas MD   Signed by:   Rochele Pages RN on 10/07/2010   Method used:   Electronically to        [facility] (retail)       [address]       [place], [place]  [address]       Ph: [phone]       Fax: [phone]   RxID:   [id number]

## 2010-11-18 ENCOUNTER — Ambulatory Visit: Payer: 59 | Admitting: Sports Medicine

## 2011-02-17 ENCOUNTER — Inpatient Hospital Stay (INDEPENDENT_AMBULATORY_CARE_PROVIDER_SITE_OTHER)
Admission: RE | Admit: 2011-02-17 | Discharge: 2011-02-17 | Disposition: A | Discharge status: Home / Self Care | Payer: Self-pay | Source: Ambulatory Visit | Attending: Emergency Medicine | Admitting: Emergency Medicine

## 2011-02-17 ENCOUNTER — Encounter: Payer: Self-pay | Admitting: Emergency Medicine

## 2011-02-17 DIAGNOSIS — T6391XA Toxic effect of contact with unspecified venomous animal, accidental (unintentional), initial encounter: Secondary | ICD-10-CM

## 2011-07-13 NOTE — Progress Notes (Signed)
Summary: RT HAND SWOLLEN/INFLAMMED/DIZZINESS rm 5   Vital Signs:  Patient Profile:   55 Years Old Female CC:      RT hand, no better Height:     64 inches O2 Sat:      97 % O2 treatment:    Room Air Temp:     98.3 degrees F oral Pulse rate:   70 / minute Resp:     16 per minute BP sitting:   156 / 83  (left arm) Cuff size:   regular  Vitals Entered By: Clemens Catholic LPN (February 17, 2011 5:42 PM)                  Updated Prior Medication List: CYMBALTA 60 MG CPEP (DULOXETINE HCL) once daily TOPAMAX 50 MG TABS (TOPIRAMATE) once daily XANAX 1 MG TABS (ALPRAZOLAM) bid CRESTOR 10 MG TABS (ROSUVASTATIN CALCIUM) once daily ASPIRIN 81 MG TBEC (ASPIRIN) once daily MULTIVITAMINS  TABS (MULTIPLE VITAMIN) once daily MELOXICAM 7.5 MG TABS (MELOXICAM) take 1-2 times daily  Current Allergies: No known allergies History of Present Illness History from: patient & fiance Chief Complaint: RT hand, no better History of Present Illness: Stung by wasp 2 days ago on her R hand.  Saw a doctor yesterday who put her on Keflex for possible celllulitis, advised to keep elevated, ice, and Benedryl. She isn't getting better yet and the swelling is worse.  She is R handed and works for [employer]. Pain is throbbing and worse with motion.  She is also having mild nausea but she doesn't know if that's from the sting or the Keflex.  No SOB or CP.  REVIEW OF SYSTEMS Constitutional Symptoms      Denies fever, chills, night sweats, weight loss, weight gain, and fatigue.  Eyes       Denies change in vision, eye pain, eye discharge, glasses, contact lenses, and eye surgery. Ear/Nose/Throat/Mouth       Denies hearing loss/aids, change in hearing, ear pain, ear discharge, dizziness, frequent runny nose, frequent nose bleeds, sinus problems, sore throat, hoarseness, and tooth pain or bleeding.  Respiratory       Denies dry cough, productive cough, wheezing, shortness of breath, asthma, bronchitis, and  emphysema/COPD.  Cardiovascular       Denies murmurs, chest pain, and tires easily with exhertion.    Gastrointestinal       Denies stomach pain, nausea/vomiting, diarrhea, constipation, blood in bowel movements, and indigestion.      Comments: nausea Genitourniary       Denies painful urination, kidney stones, and loss of urinary control. Neurological       Denies paralysis, seizures, and fainting/blackouts. Musculoskeletal       Complains of redness and swelling.      Denies muscle pain, joint pain, joint stiffness, decreased range of motion, muscle weakness, and gout.  Skin       Denies bruising, unusual mles/lumps or sores, and hair/skin or nail changes.  Psych       Denies mood changes, temper/anger issues, anxiety/stress, speech problems, depression, and sleep problems. Other Comments: pt states that her RT hand is no better, after a bee sting on 02/15/11.    Past History:  Past Medical History: Reviewed history from 01/28/2010 and no changes required. Hyperlipidemia Hypertension- past  Past Surgical History: Reviewed history from 01/28/2010 and no changes required. Tubal ligation L4 ruputre disc repair  Family History: Reviewed history from 01/28/2010 and no changes required. NA  Social History: Reviewed history from  01/28/2010 and no changes required. 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  There is a small area that looks like a bee stink between the fingers.  FROM.  The redness is approx 2x3cm, no induration, no fluctuance. Assessment New Problems: BEE STING REACTION, LOCAL  (ICD-989.5)   Plan New Orders: Admin of Therapeutic Inj  intramuscular or subcutaneous [96372] Solumedrol up to 125mg  [J2930] No Charge Patient Arrived (NCPA0) [NCPA0] Planning Comments:   Gave her Solumedrol IM 125mg  today.  Advised to follow Dr. Rolla Plate instructions with Benedryl, cool compresses, elevation, and gentle ROM.  I'm not sure if it's infected or just inflamed from the bee sting.  She may stop the Keflex for now to see if the nausea improves.  If not getting better, may want to consider Doxy or another antibiotic.  If further problems such as CP, SOB, difficulty breathing, should go to the ER.   The patient and/or caregiver has been counseled thoroughly with regard to medications prescribed including dosage, schedule, interactions, rationale for use, and possible side effects and they verbalize understanding.  Diagnoses and expected course of recovery discussed and will return if not improved as expected or if the condition worsens. Patient and/or caregiver verbalized understanding.   Medication Administration  Injection # 1:    Medication: Solumedrol up to 125mg     Diagnosis: BEE STING REACTION, LOCAL (ICD-989.5)    Route: IM    Site: RUOQ gluteus    Exp Date: 08/10/2013    Lot #: [id number]    Mfr: Pharmacia    Comments: 125 MG GIVEN    Patient tolerated injection without complications    Given by: Clemens Catholic LPN (February 17, 2011 6:01 PM)  Orders Added: 1)  Admin of Therapeutic Inj  intramuscular or subcutaneous [96372] 2)  Solumedrol up to 125mg  [J2930] 3)  No Charge Patient Arrived (NCPA0) [NCPA0]

## 2011-07-21 ENCOUNTER — Encounter: Payer: Self-pay | Admitting: Sports Medicine

## 2011-07-21 ENCOUNTER — Ambulatory Visit (INDEPENDENT_AMBULATORY_CARE_PROVIDER_SITE_OTHER): Admitting: Sports Medicine

## 2011-07-21 VITALS — BP 140/90

## 2011-07-21 DIAGNOSIS — M2550 Pain in unspecified joint: Secondary | ICD-10-CM

## 2011-07-21 DIAGNOSIS — M069 Rheumatoid arthritis, unspecified: Secondary | ICD-10-CM

## 2011-07-21 MED ORDER — DICLOFENAC SODIUM 75 MG PO TBEC: 75.0000 mg | DELAYED_RELEASE_TABLET | Freq: Two times a day (BID) | ORAL | Status: AC

## 2011-07-21 MED ORDER — TRAMADOL HCL 50 MG PO TABS: 50.0000 mg | ORAL_TABLET | Freq: Four times a day (QID) | ORAL | Status: AC | PRN

## 2011-07-21 NOTE — Assessment & Plan Note (Addendum)
Multiple migratory arthritis with elevated RF. Will get ANA, RF, CBC Referral to Rheumatology  [facility]ology did not think she warranted TX last year and wondered about false + test  However, with symmetrical sxs and changes from last visit I strongly suspect RA

## 2011-07-21 NOTE — Progress Notes (Signed)
  Subjective:    Patient ID: Sabrina Thomas, female    DOB: 28-Feb-1956, 55 y.o.   MRN: [id number]  HPI 1. Multiple joint arthritis. Patient has pain in bilateral shoulders with weakness and decreased range of motion in the left shoulder because of pain. Bilateral hands, bilateral feet, bilateral achilles tendon pain. No rash, no fever. Does have chills in the evenings, she is menopausal. She has no weight loss. Complains of dry eyes and dry mouth. No dysphagia or problems with mastication. Denies muscle pains.  Note her last RF was 3140 but her ESR was norm;  She has had elevated RF for a few years. Review of Systems See HPI    Objective:   Physical Exam Filed Vitals:   07/21/11 1550  BP: 140/90  Face puffy  Shoulders:FROM right shoulder, decreased abduction by 10 degrees on left. Weakness with abduction on left side. Ankles:FROM NTTP Feet:FROM NTTP Hands: FROM  TTP right 1st MCP joint   MCP joints are somewhat puffy more at MCP 2 and 3 bilat Wrists not swollen     Assessment & Plan:

## 2011-07-21 NOTE — Patient Instructions (Addendum)
We will refer you to Rheumatology here in [place].  Two medications for pain have been sent into your pharmacy.

## 2011-07-21 NOTE — Assessment & Plan Note (Signed)
She had been controlled on mobic but now worse again  Trial diclofenac\  Use tramadol for rescue pain

## 2011-08-18 ENCOUNTER — Encounter: Payer: Self-pay | Admitting: *Deleted

## 2011-08-18 NOTE — Progress Notes (Signed)
Patient ID: Sabrina Thomas, female   DOB: 1956-04-21, 56 y.o.   MRN: [id number] Dr. Shawnee Knapp office called and stated they do not accept the pt's insurance. They will contact the pt and inform her. i also instructed them to ask pt to call her insurance company and ask them to inform her of a few rheumatologist that are on their formulary that we can refer her to for this issue.

## 2011-12-01 ENCOUNTER — Encounter: Payer: Self-pay | Admitting: *Deleted

## 2011-12-01 ENCOUNTER — Emergency Department (INDEPENDENT_AMBULATORY_CARE_PROVIDER_SITE_OTHER)
Admission: EM | Admit: 2011-12-01 | Discharge: 2011-12-01 | Disposition: A | Discharge status: Home / Self Care | Source: Home / Self Care | Attending: Emergency Medicine | Admitting: Emergency Medicine

## 2011-12-01 DIAGNOSIS — J069 Acute upper respiratory infection, unspecified: Secondary | ICD-10-CM

## 2011-12-01 HISTORY — DX: Unspecified osteoarthritis, unspecified site: M19.90

## 2011-12-01 LAB — POCT RAPID STREP A (OFFICE): Rapid Strep A Screen: NEGATIVE

## 2011-12-01 MED ORDER — HYDROCOD POLST-CHLORPHEN POLST 10-8 MG/5ML PO LQCR: 5.0000 mL | Freq: Two times a day (BID) | ORAL | Status: DC | PRN

## 2011-12-01 MED ORDER — CLARITHROMYCIN 500 MG PO TABS: 500.0000 mg | ORAL_TABLET | Freq: Two times a day (BID) | ORAL | Status: AC

## 2011-12-01 NOTE — ED Provider Notes (Signed)
History     CSN: [id number]  Arrival date & time 12/01/11  0806   First MD Initiated Contact with Patient 12/01/11 [phone]      Chief Complaint  Patient presents with  . Sore Throat  . Cough    (Consider location/radiation/quality/duration/timing/severity/associated sxs/prior treatment) HPI Sabrina Thomas is a 56 y.o. female who complains of onset of cold symptoms for 8 days.  The symptoms are constant and mild-moderate in severity.  + Sick contacts at home. + sore throat + hoarseness + cough No pleuritic pain No wheezing + nasal congestion + post-nasal drainage + sinus pain/pressure + chest congestion No itchy/red eyes No earache No hemoptysis No SOB No chills/sweats No fever No nausea No vomiting No abdominal pain No diarrhea No skin rashes + fatigue No myalgias + headache    Past Medical History  Diagnosis Date  . Arthritis     History reviewed. No pertinent past surgical history.  History reviewed. No pertinent family history.  History  Substance Use Topics  . Smoking status: Never Smoker   . Smokeless tobacco: Not on file  . Alcohol Use: No    OB History    Grav Para Term Preterm Abortions TAB SAB Ect Mult Living                  Review of Systems  All other systems reviewed and are negative.    Allergies  Review of patient's allergies indicates no known allergies.  Home Medications   Current Outpatient Rx  Name Route Sig Dispense Refill  . ALPRAZOLAM 1 MG PO TABS Oral Take 1 mg by mouth at bedtime as needed. 2xday     . HYDROCOD POLST-CPM POLST ER 10-8 MG/5ML PO LQCR Oral Take 5 mLs by mouth every 12 (twelve) hours as needed. 115 mL 0  . CLARITHROMYCIN 500 MG PO TABS Oral Take 1 tablet (500 mg total) by mouth 2 (two) times daily. 16 tablet 0  . COLESEVELAM HCL 625 MG PO TABS Oral Take 1,875 mg by mouth 2 (two) times daily with a meal. 3 tabs 2xday     . DICLOFENAC SODIUM 75 MG PO TBEC Oral Take 1 tablet (75 mg total) by mouth 2 (two) times  daily with a meal. 60 tablet 2  . DULOXETINE HCL 30 MG PO CPEP Oral Take 30 mg by mouth daily. 3 cap in am     . MELOXICAM 7.5 MG PO TABS Oral Take 7.5 mg by mouth daily. 2xday     . TRAMADOL HCL 50 MG PO TABS Oral Take 1 tablet (50 mg total) by mouth every 6 (six) hours as needed for pain. Maximum dose= 8 tablets per day 20 tablet 0    BP 129/92  Pulse 69  Temp(Src) 98.6 F (37 C) (Oral)  Resp 14  Ht 5' 3.75" (1.619 m)  Wt 165 lb (74.844 kg)  BMI 28.54 kg/m2  SpO2 97%  Physical Exam  Nursing note and vitals reviewed. Constitutional: She is oriented to person, place, and time. She appears well-developed and well-nourished.  HENT:  Head: Normocephalic and atraumatic.  Right Ear: Tympanic membrane, external ear and ear canal normal.  Left Ear: Tympanic membrane, external ear and ear canal normal.  Nose: Mucosal edema and rhinorrhea present.  Mouth/Throat: Posterior oropharyngeal erythema present. No oropharyngeal exudate or posterior oropharyngeal edema.  Eyes: No scleral icterus.  Neck: Neck supple.  Cardiovascular: Regular rhythm and normal heart sounds.   Pulmonary/Chest: Effort normal and breath sounds normal. No respiratory  distress.  Neurological: She is alert and oriented to person, place, and time.  Skin: Skin is warm and dry.  Psychiatric: She has a normal mood and affect. Her speech is normal.    ED Course  Procedures (including critical care time)   Labs Reviewed  POCT RAPID STREP A (OFFICE)   No results found.   1. Acute upper respiratory infections of unspecified site       MDM  1)  Take the prescribed antibiotic as instructed.  If not improving in a few days, patient to call back and we will call in Prednisone. 2)  Use nasal saline solution (over the counter) at least 3 times a day. 3)  Use over the counter decongestants like Zyrtec-D every 12 hours as needed to help with congestion.  If you have hypertension, do not take medicines with sudafed.  4)   Can take tylenol every 6 hours or motrin every 8 hours for pain or fever. 5)  Follow up with your primary doctor if no improvement in 5-7 days, sooner if increasing pain, fever, or new symptoms.      Marlaine Hind, MD 12/01/11 [phone]

## 2011-12-01 NOTE — ED Notes (Signed)
Pt c/o sore throat, cough and congestion x 8 days. Denies fever.

## 2013-05-15 ENCOUNTER — Encounter (HOSPITAL_BASED_OUTPATIENT_CLINIC_OR_DEPARTMENT_OTHER): Payer: Self-pay | Admitting: *Deleted

## 2013-05-15 NOTE — Progress Notes (Signed)
NPO AFTER MN. ARRIVES AT 0600.  NEEDS EKG. CURRENT LAB RESULTS W/ CHART. WILL TAKE NORCO AM OF SURG W/ SIPS OF WATER.

## 2013-05-18 ENCOUNTER — Encounter (HOSPITAL_BASED_OUTPATIENT_CLINIC_OR_DEPARTMENT_OTHER): Payer: Self-pay | Admitting: Podiatry

## 2013-05-18 NOTE — H&P (Signed)
Visit Note - Pre-Op/POV   Provider: Sherlynn Stalls, DPM Encounter Date: May 10, 2013  Patient: Sabrina Thomas, Sabrina Thomas    ([id number]) Sex: Female       DOB: 1955-12-05      Age: 57 year 12 month       Race: White Address: [address] ,  [place]  [place]  [address] Primary Dr.: Cathrine Muster Insurance: [employer] [place]  Referred By:  Cathrine Muster  Complaint: Chief Complaint: Preop visit, tentative surgery date is to be determined.  History of present illness: Nature: aching, sharp and throbbing. Location: posterior heel, Achilles tendon , bilaterally. Onset: (Acute vs Insidious): Gradual. Patient denies diabetes.  Patient here for blood work and to sign surgical paperwork. Patient had sx date previously scheduled but had to cancel.  Current Medication: 1. Norco 7.5-325 Tablet Mg  SIG: one or two every four to six hours as needed for pain 2. Crestor 10 Mg Tablet (Other MD)  3. Hydrochlorothiazide 12.5 Mg Tb (Other MD)  4. Calcium 500 Mg Chewable Tablet 500 Mg Calcium (1,250 Mg) (Other MD)  5. Cymbalta 30 Mg Capsule (Other MD)  6. Multivitamins Capsule (Other MD)  7. Vitamin D3 50,000 Units Caps (Other MD)   ROS: Integument: Negative for rashes, skin cancer, slow healing wounds, cellulitis. Musculoskeletal:  (+) for arthritis ; (+) for bursitis. Constitutional: No fever, chills, or nausea. Immunologic: No new allergies.  Medical History: Arthritis.  High Cholesterol.  High Blood Pressure.  Vascular grafts: None.  Joint implant(s): None.  Heart valve(s): None.  Chemotherapy: No.  Serious Injuries: None.   Surgeries: L4/5 Back Surgery 1998.  Family History/Social History: Patient denies being currently pregnant.  Smoking History: Patient denies tobacco use. Alcohol Use: Patient denies alcohol use. INSULIN: Patient denies taking insulin. COUMADIN/PLAVIX USE: Patient denies taking coumadin/plavix. Family History is Unremarkable. Work: Sitting  position.  Allergy: NKDA - No Known Drug Allergies  Examination: Patient is pleasant, cooperative, alert and oriented times three in no distress. Light touch sensation and reflexes are intact. Vascular intact with palpable pedal pulses and no significant edema in the lower extremities. Good muscle strength at the ankles and toes. No crepitation or laxity noted.   HEEL PAIN: Pain noted at the LEFT ACHILLES TENDON INSERTION. Pain noted at the RIGHT ACHILLES TENDON INSERTION. Gastroc equinus noted with range of motion. ANKLE SPRAIN: Pain and swelling in the RIGHT ANKLE. Effusion noted in the joint, location: Lateral ankle.  Diagnosis: 726.71  Achilles Tendinitis or Bursitis   PLAN: Clinical Summary Letter made available to patient within three business days. This summary is a preliminary report and not a chart note. Information may not be finalized. For complete chart noted from the medical record please call to request.  Established office visit to discuss etiology treatment and prognosis   Diagnosis: Tenosynovitis  TREATMENT PLAN: Surgical Consultation: Discussed surgical versus conservative treatment options. Risks versus benefits were discussed along with the nature of the procedure, post-operative course. Discussed possible complications, not limited to, such as: slow-healing, infection, need for further surgery, chronic pain (RSDS), blood clots, bleeding problems, weakness, chronic swelling of the foot/digits, and arthritis. Rare but serious complications can even result in loss of digit, loss of limb, or loss of life. No guarantees were given. Alternatives to surgery were discussed today.  PLANNED PROCEDURE(S). Bilateral: Topaz secondary Achilles tendon repair.   We will begin planning for scheduling the surgery based on patient's preference.  Venipuncture: Labs drawn for Surgical profile.  Supplies:  AFO AIR SPORT:  Prescribed AFO-MULTILIGAMENTOUS SUPPORT for the RIGHT ANKLE.  Indications: minimize painful motion at the talocalcaneal and ankle joint . Patient will make a decision today based on cost and/or precertification. I believe these are necessary based on patient's condition and should help in the long term management of symptoms.   PNEUMATIC CAM WALKER:  Prescribed PNEUMATIC CAM WALKER for the BILATERAL LOWER EXTREMITY. Indications: (limit motion at the foot and ankle) . Patient will make a decision today based on cost and/or precertification. I believe these are necessary based on patient's condition and should help in the long term management of symptoms. .  DME Prescriptions: Rx: AFO AirSport Brace. Multiligamentous support brace, prefabricated. Disp: Right  Sig: Use with sturdy lace up shoe while weightbearing as directed. We went over the instructions on how to use the device properly and found to be a good fit. Discussed short term and long term use of the AFO and how it helps with the patient's condition.  Rx: Pneumatic Cam Walker Disp: Bilaterally  Sig: Use as directed while weightbearing. Can remove for range of motion and bathing. This has a rocker bottom to offload the foot during ambulation. Can be removed when nonweightbearing to perform range of motion exercises and keep muscles toned. This should reduce the risk of blood clots compared to a fiberglass cast. Patient instructed on the use of the device.   Clearance was received on September 30 her primary care physician

## 2013-05-22 ENCOUNTER — Encounter (HOSPITAL_BASED_OUTPATIENT_CLINIC_OR_DEPARTMENT_OTHER)
Admission: RE | Disposition: A | Discharge status: Home / Self Care | Payer: Self-pay | Source: Ambulatory Visit | Attending: Podiatry

## 2013-05-22 ENCOUNTER — Ambulatory Visit (HOSPITAL_BASED_OUTPATIENT_CLINIC_OR_DEPARTMENT_OTHER): Admitting: Anesthesiology

## 2013-05-22 ENCOUNTER — Encounter (HOSPITAL_BASED_OUTPATIENT_CLINIC_OR_DEPARTMENT_OTHER): Admitting: Anesthesiology

## 2013-05-22 ENCOUNTER — Ambulatory Visit (HOSPITAL_BASED_OUTPATIENT_CLINIC_OR_DEPARTMENT_OTHER)
Admission: RE | Admit: 2013-05-22 | Discharge: 2013-05-22 | Disposition: A | Discharge status: Home / Self Care | Source: Ambulatory Visit | Attending: Podiatry | Admitting: Podiatry

## 2013-05-22 ENCOUNTER — Encounter (HOSPITAL_BASED_OUTPATIENT_CLINIC_OR_DEPARTMENT_OTHER): Payer: Self-pay

## 2013-05-22 DIAGNOSIS — M7660 Achilles tendinitis, unspecified leg: Secondary | ICD-10-CM

## 2013-05-22 DIAGNOSIS — M129 Arthropathy, unspecified: Secondary | ICD-10-CM | POA: Insufficient documentation

## 2013-05-22 DIAGNOSIS — I10 Essential (primary) hypertension: Secondary | ICD-10-CM | POA: Insufficient documentation

## 2013-05-22 DIAGNOSIS — E7800 Pure hypercholesterolemia, unspecified: Secondary | ICD-10-CM | POA: Insufficient documentation

## 2013-05-22 HISTORY — PX: ACHILLES TENDON SURGERY: SHX542

## 2013-05-22 HISTORY — DX: Migraine, unspecified, not intractable, without status migrainosus: G43.909

## 2013-05-22 HISTORY — DX: Vitamin D deficiency, unspecified: E55.9

## 2013-05-22 HISTORY — DX: Hyperlipidemia, unspecified: E78.5

## 2013-05-22 HISTORY — DX: Essential (primary) hypertension: I10

## 2013-05-22 HISTORY — DX: Achilles tendinitis, unspecified leg: M76.60

## 2013-05-22 SURGERY — KIDNER PROCEDURE, MODIFIED
Anesthesia: Monitor Anesthesia Care | Site: Foot | Laterality: Bilateral | Wound class: Clean

## 2013-05-22 MED ORDER — PROPOFOL 10 MG/ML IV EMUL
INTRAVENOUS | Status: DC | PRN
  Administered 2013-05-22: 50 ug/kg/min via INTRAVENOUS

## 2013-05-22 MED ORDER — ONDANSETRON HCL 4 MG/2ML IJ SOLN
INTRAMUSCULAR | Status: DC | PRN
  Administered 2013-05-22: 4 mg via INTRAMUSCULAR

## 2013-05-22 MED ORDER — MIDAZOLAM HCL 5 MG/5ML IJ SOLN
INTRAMUSCULAR | Status: DC | PRN
  Administered 2013-05-22: 2 mg via INTRAVENOUS

## 2013-05-22 MED ORDER — LIDOCAINE HCL 1 % IJ SOLN
INTRAMUSCULAR | Status: DC | PRN
  Administered 2013-05-22 (×2): via INTRAMUSCULAR

## 2013-05-22 MED ORDER — KETOROLAC TROMETHAMINE 30 MG/ML IJ SOLN
30.0000 mg | Freq: Once | INTRAMUSCULAR | Status: DC
  Filled 2013-05-22: qty 1

## 2013-05-22 MED ORDER — FENTANYL CITRATE 0.05 MG/ML IJ SOLN
INTRAMUSCULAR | Status: DC | PRN
  Administered 2013-05-22: 50 ug via INTRAVENOUS
  Administered 2013-05-22: 25 ug via INTRAVENOUS
  Administered 2013-05-22 (×2): 12.5 ug via INTRAVENOUS

## 2013-05-22 MED ORDER — LIDOCAINE HCL (CARDIAC) 20 MG/ML IV SOLN
INTRAVENOUS | Status: DC | PRN
  Administered 2013-05-22: 50 mg via INTRAVENOUS

## 2013-05-22 MED ORDER — SODIUM CHLORIDE 0.9 % IR SOLN
Status: DC | PRN
  Administered 2013-05-22: 08:00:00

## 2013-05-22 MED ORDER — DEXAMETHASONE SODIUM PHOSPHATE 4 MG/ML IJ SOLN
INTRAMUSCULAR | Status: DC | PRN
  Administered 2013-05-22: 4 mg via INTRAVENOUS

## 2013-05-22 MED ORDER — CEFAZOLIN SODIUM-DEXTROSE 2-3 GM-% IV SOLR
2.0000 g | INTRAVENOUS | Status: AC
  Administered 2013-05-22: 2 g via INTRAVENOUS
  Filled 2013-05-22: qty 50

## 2013-05-22 MED ORDER — LACTATED RINGERS IV SOLN
INTRAVENOUS | Status: DC
  Administered 2013-05-22: 07:00:00 via INTRAVENOUS
  Filled 2013-05-22: qty 1000

## 2013-05-22 SURGICAL SUPPLY — 41 items
BANDAGE CONFORM 3  STR LF (GAUZE/BANDAGES/DRESSINGS) ×4 IMPLANT
BLADE SURG 15 STRL LF DISP TIS (BLADE) ×1 IMPLANT
BLADE SURG 15 STRL SS (BLADE) ×1
BNDG COHESIVE 3X5 TAN STRL LF (GAUZE/BANDAGES/DRESSINGS) ×2 IMPLANT
BNDG COHESIVE 4X5 TAN NS LF (GAUZE/BANDAGES/DRESSINGS) ×2 IMPLANT
BNDG ESMARK 4X9 LF (GAUZE/BANDAGES/DRESSINGS) ×2 IMPLANT
CHLORAPREP W/TINT 26ML (MISCELLANEOUS) ×2 IMPLANT
CLOTH BEACON ORANGE TIMEOUT ST (SAFETY) ×2 IMPLANT
COVER TABLE BACK 60X90 (DRAPES) ×2 IMPLANT
CUFF TOURN SGL QUICK 18 (TOURNIQUET CUFF) ×4 IMPLANT
DRAPE EXTREMITY T 121X128X90 (DRAPE) ×4 IMPLANT
DRAPE LG THREE QUARTER DISP (DRAPES) ×2 IMPLANT
DRAPE SURG 17X23 STRL (DRAPES) IMPLANT
ELECT REM PT RETURN 9FT ADLT (ELECTROSURGICAL) ×2
ELECTRODE REM PT RTRN 9FT ADLT (ELECTROSURGICAL) ×1 IMPLANT
GAUZE SPONGE 4X4 12PLY STRL LF (GAUZE/BANDAGES/DRESSINGS) ×2 IMPLANT
GAUZE XEROFORM 1X8 LF (GAUZE/BANDAGES/DRESSINGS) ×2 IMPLANT
GLOVE SURG SS PI 8.0 STRL IVOR (GLOVE) ×4 IMPLANT
GOWN PREVENTION PLUS LG XLONG (DISPOSABLE) ×2 IMPLANT
GOWN PREVENTION PLUS XLARGE (GOWN DISPOSABLE) ×2 IMPLANT
GUIDEWIRE ORTH 6X062XTROC NS (WIRE) ×1 IMPLANT
K-WIRE .062 (WIRE) ×1
NEEDLE HYPO 25X1 1.5 SAFETY (NEEDLE) ×4 IMPLANT
NS IRRIG 500ML POUR BTL (IV SOLUTION) ×2 IMPLANT
PACK BASIN DAY SURGERY FS (CUSTOM PROCEDURE TRAY) ×2 IMPLANT
PADDING CAST ABS 4INX4YD NS (CAST SUPPLIES) ×1
PADDING CAST ABS COTTON 4X4 ST (CAST SUPPLIES) ×1 IMPLANT
PENCIL BUTTON HOLSTER BLD 10FT (ELECTRODE) IMPLANT
SPONGE GAUZE 4X4 12PLY (GAUZE/BANDAGES/DRESSINGS) ×2 IMPLANT
STOCKINETTE 4X48 STRL (DRAPES) ×2 IMPLANT
SUCTION FRAZIER TIP 10 FR DISP (SUCTIONS) IMPLANT
SUT ETHILON 4 0 PS 2 18 (SUTURE) ×2 IMPLANT
SUT VIC AB 3-0 PS1 18 (SUTURE)
SUT VIC AB 3-0 PS1 18XBRD (SUTURE) IMPLANT
SUT VICRYL 4-0 PS2 18IN ABS (SUTURE) IMPLANT
SYR BULB 3OZ (MISCELLANEOUS) ×2 IMPLANT
SYR CONTROL 10ML LL (SYRINGE) ×6 IMPLANT
TOPAZ MICROBLATION (SURGICAL WAND) ×2 IMPLANT
TOWEL OR 17X24 6PK STRL BLUE (TOWEL DISPOSABLE) ×2 IMPLANT
TUBE CONNECTING 12X1/4 (SUCTIONS) IMPLANT
UNDERPAD 30X30 INCONTINENT (UNDERPADS AND DIAPERS) ×2 IMPLANT

## 2013-05-22 NOTE — Op Note (Signed)
05/22/2013  8:20 AM  PATIENT:  Sabrina Thomas  57 y.o. female  PRE-OPERATIVE DIAGNOSIS:  BILATERAL ACHILLES TENDONITIS  POST-OPERATIVE DIAGNOSIS:  BILATERAL ACHILLES TENDONITIS  PROCEDURE:  Procedure(s): TOPAZ SECONDARY ACHILLES TENDON REPAIR BILATERAL HEELS (Bilateral)  SURGEON:  Surgeon(s) and Role:    * Sherin Quarry, DPM - Primary  PHYSICIAN ASSISTANT:   ASSISTANTS: none   ANESTHESIA:   local and IV sedation  EBL:  Total I/O In: 200 [I.V.:200] Out: -   BLOOD ADMINISTERED:none  DRAINS: none   LOCAL MEDICATIONS USED:  MARCAINE    and LIDOCAINE   SPECIMEN:  No Specimen  DISPOSITION OF SPECIMEN:  N/A  COUNTS:  YES  TOURNIQUET:   Total Tourniquet Time Documented: Calf (N/A) - 10 minutes Calf (N/A) - 7 minutes Total: Calf (N/A) - 17 minutes   DICTATION: .Dragon Dictation  PLAN OF CARE: Discharge to home after PACU  PATIENT DISPOSITION:  PACU - hemodynamically stable.   Delay start of Pharmacological VTE agent (>24hrs) due to surgical blood loss or risk of bleeding: not applicable  SURGICAL INDICATIONS:  Patient is here for surgical intervention and surgery is further discussed today based on our in-office consultation.  All questions were answered and consent is signed and in the chart outlining risks versus benefits.  The surgical site is marked today and I reviewed the planned procedure(s) with the patient today in preoperative holding area.  She has painful Achilles tendinitis bilaterally and is requesting surgery since conservative efforts have failed. We talked about doing a minimal invasive procedure which could be done bilaterally and she wishes to proceed with the Topaz Achilles tendon repair.  PROCEDURES PERFORMED:  Patient is transported to the operating room and the surgical site is prepped and draped in the usual sterile manner.  Ankle tourniquet is inflated to 250 mm mercury bilaterally. Attention stretch to the posterior ankle and Achilles tendon  and used to surgical marker to mark 15 locations on the posterior Achilles tendon. 62 K wire was used to perform stab incisions. We then used the Topaz microdebrider wand along with irrigation and performed radiofrequency ablation debridements into each of the 15 incisions. Performed about 3 or 4 debridements per incision varying depths and orientations into the Achilles tendon. This procedure was performed bilaterally similarly. Patient tolerated the procedure well and Xeroform gauze applied to the incision sites. Wet-to-dry dressing is applied, tourniquet is deflated noting good capillary refill time. Patient is placed in a Jones compression cast with cast padding and stockinette and Coban.  Patient returns to PACU having tolerated the procedure and anesthesia well.  Patient is given written and oral post op instructions.  Patient will follow up in my office as scheduled for a post op appointment.

## 2013-05-22 NOTE — Anesthesia Postprocedure Evaluation (Signed)
  Anesthesia Post-op Note  Patient: Sabrina Thomas  Procedure(s) Performed: Procedure(s) (LRB): TOPAZ SECONDARY ACHILLES TENDON REPAIR BILATERAL HEELS (Bilateral)  Patient Location: PACU  Anesthesia Type: MAC  Level of Consciousness: awake and alert   Airway and Oxygen Therapy: Patient Spontanous Breathing  Post-op Pain: mild  Post-op Assessment: Post-op Vital signs reviewed, Patient's Cardiovascular Status Stable, Respiratory Function Stable, Patent Airway and No signs of Nausea or vomiting  Last Vitals:  Filed Vitals:   05/22/13 0607  BP: 142/78  Pulse: 60  Temp: 36.4 C  Resp: 16    Post-op Vital Signs: stable   Complications: No apparent anesthesia complications

## 2013-05-22 NOTE — Anesthesia Preprocedure Evaluation (Addendum)
Anesthesia Evaluation  Patient identified by MRN, date of birth, ID band Patient awake    Reviewed: Allergy & Precautions, H&P , NPO status , Patient's Chart, lab work & pertinent test results  Airway Mallampati: II TM Distance: >3 FB Neck ROM: full    Dental no notable dental hx. (+) Teeth Intact and Dental Advisory Given   Pulmonary neg pulmonary ROS,  breath sounds clear to auscultation  Pulmonary exam normal       Cardiovascular Exercise Tolerance: Good hypertension, Pt. on medications Rhythm:regular Rate:Normal     Neuro/Psych negative neurological ROS  negative psych ROS   GI/Hepatic negative GI ROS, Neg liver ROS,   Endo/Other  negative endocrine ROS  Renal/GU negative Renal ROS  negative genitourinary   Musculoskeletal  (+) Arthritis -, Rheumatoid disorders,    Abdominal   Peds  Hematology negative hematology ROS (+)   Anesthesia Other Findings   Reproductive/Obstetrics negative OB ROS                          Anesthesia Physical Anesthesia Plan  ASA: II  Anesthesia Plan: MAC   Post-op Pain Management:    Induction:   Airway Management Planned: Simple Face Mask  Additional Equipment:   Intra-op Plan:   Post-operative Plan:   Informed Consent: I have reviewed the patients History and Physical, chart, labs and discussed the procedure including the risks, benefits and alternatives for the proposed anesthesia with the patient or authorized representative who has indicated his/her understanding and acceptance.   Dental Advisory Given  Plan Discussed with: CRNA and Surgeon  Anesthesia Plan Comments:         Anesthesia Quick Evaluation

## 2013-05-22 NOTE — Transfer of Care (Signed)
Immediate Anesthesia Transfer of Care Note  Patient: Sabrina Thomas  Procedure(s) Performed: Procedure(s) (LRB): TOPAZ SECONDARY ACHILLES TENDON REPAIR BILATERAL HEELS (Bilateral)  Patient Location: PACU  Anesthesia Type:MAC  Level of Consciousness: awake, alert  and oriented  Airway & Oxygen Therapy: Patient Spontanous Breathing and Patient connected to face mask oxygen  Post-op Assessment: Report given to PACU RN and Post -op Vital signs reviewed and stable  Post vital signs: Reviewed and stable  Complications: No apparent anesthesia complications

## 2013-05-23 ENCOUNTER — Encounter (HOSPITAL_BASED_OUTPATIENT_CLINIC_OR_DEPARTMENT_OTHER): Payer: Self-pay | Admitting: Podiatry

## 2013-06-15 ENCOUNTER — Other Ambulatory Visit: Payer: Self-pay

## 2014-04-03 ENCOUNTER — Other Ambulatory Visit (HOSPITAL_COMMUNITY): Payer: Self-pay | Admitting: Internal Medicine

## 2014-04-03 DIAGNOSIS — Z1231 Encounter for screening mammogram for malignant neoplasm of breast: Secondary | ICD-10-CM

## 2014-04-04 ENCOUNTER — Ambulatory Visit (HOSPITAL_COMMUNITY)
Admission: RE | Admit: 2014-04-04 | Discharge: 2014-04-04 | Disposition: A | Discharge status: Home / Self Care | Source: Ambulatory Visit | Attending: Internal Medicine | Admitting: Internal Medicine

## 2014-04-04 DIAGNOSIS — Z1231 Encounter for screening mammogram for malignant neoplasm of breast: Secondary | ICD-10-CM | POA: Diagnosis present

## 2015-07-11 ENCOUNTER — Other Ambulatory Visit: Payer: Self-pay

## 2015-07-11 DIAGNOSIS — Z1231 Encounter for screening mammogram for malignant neoplasm of breast: Secondary | ICD-10-CM

## 2015-09-09 ENCOUNTER — Ambulatory Visit

## 2015-10-15 ENCOUNTER — Other Ambulatory Visit: Payer: Self-pay | Admitting: Obstetrics

## 2015-10-15 DIAGNOSIS — R2231 Localized swelling, mass and lump, right upper limb: Secondary | ICD-10-CM

## 2015-10-17 ENCOUNTER — Other Ambulatory Visit: Payer: Self-pay | Admitting: Obstetrics

## 2015-10-17 DIAGNOSIS — E2839 Other primary ovarian failure: Secondary | ICD-10-CM

## 2015-10-22 ENCOUNTER — Ambulatory Visit
Admission: RE | Admit: 2015-10-22 | Discharge: 2015-10-22 | Disposition: A | Discharge status: Home / Self Care | Source: Ambulatory Visit | Attending: Obstetrics | Admitting: Obstetrics

## 2015-10-22 ENCOUNTER — Other Ambulatory Visit: Payer: Self-pay | Admitting: Obstetrics

## 2015-10-22 DIAGNOSIS — R2231 Localized swelling, mass and lump, right upper limb: Secondary | ICD-10-CM

## 2015-10-31 ENCOUNTER — Other Ambulatory Visit (HOSPITAL_BASED_OUTPATIENT_CLINIC_OR_DEPARTMENT_OTHER): Payer: Self-pay | Admitting: Internal Medicine

## 2015-10-31 DIAGNOSIS — Z780 Asymptomatic menopausal state: Secondary | ICD-10-CM

## 2015-10-31 DIAGNOSIS — E2839 Other primary ovarian failure: Secondary | ICD-10-CM

## 2015-10-31 DIAGNOSIS — Z13820 Encounter for screening for osteoporosis: Secondary | ICD-10-CM

## 2015-11-04 ENCOUNTER — Other Ambulatory Visit

## 2015-11-04 ENCOUNTER — Ambulatory Visit (HOSPITAL_BASED_OUTPATIENT_CLINIC_OR_DEPARTMENT_OTHER)
Admission: RE | Admit: 2015-11-04 | Discharge: 2015-11-04 | Disposition: A | Discharge status: Home / Self Care | Source: Ambulatory Visit | Attending: Internal Medicine | Admitting: Internal Medicine

## 2015-11-04 DIAGNOSIS — Z780 Asymptomatic menopausal state: Secondary | ICD-10-CM | POA: Insufficient documentation

## 2015-11-04 DIAGNOSIS — Z13820 Encounter for screening for osteoporosis: Secondary | ICD-10-CM | POA: Diagnosis present

## 2015-11-04 DIAGNOSIS — M8580 Other specified disorders of bone density and structure, unspecified site: Secondary | ICD-10-CM | POA: Insufficient documentation

## 2016-11-05 ENCOUNTER — Other Ambulatory Visit (HOSPITAL_BASED_OUTPATIENT_CLINIC_OR_DEPARTMENT_OTHER): Payer: Self-pay | Admitting: Internal Medicine

## 2016-11-05 DIAGNOSIS — Z1231 Encounter for screening mammogram for malignant neoplasm of breast: Secondary | ICD-10-CM

## 2016-11-19 ENCOUNTER — Ambulatory Visit (HOSPITAL_BASED_OUTPATIENT_CLINIC_OR_DEPARTMENT_OTHER)
Admission: RE | Admit: 2016-11-19 | Discharge: 2016-11-19 | Disposition: A | Discharge status: Home / Self Care | Source: Ambulatory Visit | Attending: Internal Medicine | Admitting: Internal Medicine

## 2016-11-19 DIAGNOSIS — Z9882 Breast implant status: Secondary | ICD-10-CM | POA: Diagnosis not present

## 2016-11-19 DIAGNOSIS — Z1231 Encounter for screening mammogram for malignant neoplasm of breast: Secondary | ICD-10-CM | POA: Diagnosis present

## 2017-12-23 ENCOUNTER — Other Ambulatory Visit (HOSPITAL_BASED_OUTPATIENT_CLINIC_OR_DEPARTMENT_OTHER): Payer: Self-pay | Admitting: Internal Medicine

## 2017-12-23 DIAGNOSIS — Z1231 Encounter for screening mammogram for malignant neoplasm of breast: Secondary | ICD-10-CM

## 2017-12-24 ENCOUNTER — Ambulatory Visit (HOSPITAL_BASED_OUTPATIENT_CLINIC_OR_DEPARTMENT_OTHER)
Admission: RE | Admit: 2017-12-24 | Discharge: 2017-12-24 | Disposition: A | Discharge status: Home / Self Care | Source: Ambulatory Visit | Attending: Internal Medicine | Admitting: Internal Medicine

## 2017-12-24 DIAGNOSIS — Z1231 Encounter for screening mammogram for malignant neoplasm of breast: Secondary | ICD-10-CM | POA: Diagnosis present

## 2019-11-30 ENCOUNTER — Other Ambulatory Visit (HOSPITAL_BASED_OUTPATIENT_CLINIC_OR_DEPARTMENT_OTHER): Payer: Self-pay | Admitting: Internal Medicine

## 2019-11-30 DIAGNOSIS — Z1231 Encounter for screening mammogram for malignant neoplasm of breast: Secondary | ICD-10-CM

## 2019-12-04 ENCOUNTER — Ambulatory Visit (HOSPITAL_BASED_OUTPATIENT_CLINIC_OR_DEPARTMENT_OTHER)
Admission: RE | Admit: 2019-12-04 | Discharge: 2019-12-04 | Disposition: A | Discharge status: Home / Self Care | Source: Ambulatory Visit | Attending: Internal Medicine | Admitting: Internal Medicine

## 2019-12-04 ENCOUNTER — Other Ambulatory Visit: Payer: Self-pay

## 2019-12-04 DIAGNOSIS — Z1231 Encounter for screening mammogram for malignant neoplasm of breast: Secondary | ICD-10-CM | POA: Diagnosis not present

## 2021-02-08 ENCOUNTER — Emergency Department (INDEPENDENT_AMBULATORY_CARE_PROVIDER_SITE_OTHER)
Admission: EM | Admit: 2021-02-08 | Discharge: 2021-02-08 | Disposition: A | Discharge status: Home / Self Care | Payer: Medicare Other | Source: Home / Self Care | Attending: Family Medicine | Admitting: Family Medicine

## 2021-02-08 ENCOUNTER — Encounter: Payer: Self-pay | Admitting: Emergency Medicine

## 2021-02-08 ENCOUNTER — Other Ambulatory Visit: Payer: Self-pay

## 2021-02-08 DIAGNOSIS — R059 Cough, unspecified: Secondary | ICD-10-CM

## 2021-02-08 MED ORDER — BENZONATATE 200 MG PO CAPS: 200.0000 mg | ORAL_CAPSULE | Freq: Two times a day (BID) | ORAL | 0 refills | Status: AC | PRN

## 2021-02-08 NOTE — Discharge Instructions (Addendum)
Drink lots of water Take the tessalon 2 x a day Check test results on My Chart See your doctor if not better by tuesday

## 2021-02-08 NOTE — ED Provider Notes (Signed)
[name]    CSN: [id number] Arrival date & time: 02/08/21  0919      History   Chief Complaint Chief Complaint  Patient presents with   Cough    HPI Sabrina Thomas is a 65 y.o. female.   HPI  Patient is here for cough and respiratory infection.  She thinks she may have caught it from a grandchild.  Symptoms been going on for 3 days.  She did take a COVID test which was negative.  She is COVID vaccinated.  No headache or body ache.  No sweats chills or fever.  No sore throat.  Patient states other than high cholesterol she is in good health.  She is retired  Past Medical History:  Diagnosis Date   Achilles tendonitis    bilateral   Arthritis    fingers   Hyperlipidemia    Hypertension    Migraine    Vitamin D deficiency     Patient Active Problem List   Diagnosis Date Noted   Rheumatoid arthritis(714.0) 07/21/2011   ARTHRALGIA 06/05/2010   Pain in limb 05/08/2010   HYPERLIPIDEMIA 01/28/2010   HYPERTENSION 01/28/2010   ACHILLES TENDINITIS 01/28/2010   PLANTAR FASCIITIS, BILATERAL 01/28/2010    Past Surgical History:  Procedure Laterality Date   ACHILLES TENDON SURGERY Bilateral 05/22/2013   Procedure: TOPAZ SECONDARY ACHILLES TENDON REPAIR BILATERAL HEELS;  Surgeon: Sherin Quarry, DPM;  Location: [facility];  Service: Podiatry;  Laterality: Bilateral;   LUMBAR SPINE SURGERY  1995  &  1998   L4 -- L5    OB History   No obstetric history on file.      Home Medications    Prior to Admission medications   Medication Sig Start Date End Date Taking? Authorizing Provider  benzonatate (TESSALON) 200 MG capsule Take 1 capsule (200 mg total) by mouth 2 (two) times daily as needed for cough. 02/08/21  Yes Eustace Moore, MD  Cholecalciferol (VITAMIN D3) 50000 UNITS CAPS Take 1 capsule by mouth once a week.   Yes [provider]  escitalopram (LEXAPRO) 5 MG tablet  10/21/20  Yes [provider]  rosuvastatin  (CRESTOR) 10 MG tablet Take 10 mg by mouth every evening.   Yes [provider]    Family History No family history on file.  Social History Social History   Tobacco Use   Smoking status: Never   Smokeless tobacco: Never  Substance Use Topics   Alcohol use: No   Drug use: No     Allergies   Patient has no known allergies.   Review of Systems Review of Systems See HPI  Physical Exam Triage Vital Signs ED Triage Vitals  Enc Vitals Group     BP 02/08/21 0950 (!) 147/82     Pulse Rate 02/08/21 0950 88     Resp 02/08/21 0954 16     Temp 02/08/21 0950 99.6 F (37.6 C)     Temp Source 02/08/21 0950 Oral     SpO2 02/08/21 0950 91 %     Weight --      Height --      Head Circumference --      Peak Flow --      Pain Score 02/08/21 0951 0     Pain Loc --      Pain Edu? --      Excl. in GC? --    No data found.  Updated Vital Signs BP (!) 147/82 (  BP Location: Right Arm)   Pulse 88   Temp 99.6 F (37.6 C) (Oral)   Resp 16   SpO2 91%      Physical Exam Constitutional:      General: She is not in acute distress.    Appearance: She is well-developed.  HENT:     Head: Normocephalic and atraumatic.     Nose: Congestion present.     Comments: Clear rhinorrhea    Mouth/Throat:     Mouth: Mucous membranes are moist.     Pharynx: Posterior oropharyngeal erythema present.  Eyes:     Conjunctiva/sclera: Conjunctivae normal.     Pupils: Pupils are equal, round, and reactive to light.  Cardiovascular:     Rate and Rhythm: Normal rate and regular rhythm.     Heart sounds: Normal heart sounds.  Pulmonary:     Effort: Pulmonary effort is normal. No respiratory distress.     Breath sounds: Normal breath sounds.  Abdominal:     General: There is no distension.     Palpations: Abdomen is soft.  Musculoskeletal:        General: Normal range of motion.     Cervical back: Normal range of motion.  Skin:    General: Skin is warm and dry.  Neurological:      Mental Status: She is alert.  Psychiatric:        Mood and Affect: Mood normal.     UC Treatments / Results  Labs (all labs ordered are listed, but only abnormal results are displayed) Labs Reviewed  COVID-19, FLU A+B AND RSV    EKG   Radiology No results found.  Procedures Procedures (including critical care time)  Medications Ordered in UC Medications - No data to display  Initial Impression / Assessment and Plan / UC Course  I have reviewed the triage vital signs and the nursing notes.  Pertinent labs & imaging results that were available during my care of the patient were reviewed by me and considered in my medical decision making (see chart for details).     Discussed likely viral illness.  Swab was done because patient is concerned about spreading infection to other grandchildren.  She will remain home with symptomatic care.  Return as needed Final Clinical Impressions(s) / UC Diagnoses   Final diagnoses:  Cough     Discharge Instructions      Drink lots of water Take the tessalon 2 x a day Check test results on My Chart See your doctor if not better by tuesday   ED Prescriptions     Medication Sig Dispense Auth. Provider   benzonatate (TESSALON) 200 MG capsule Take 1 capsule (200 mg total) by mouth 2 (two) times daily as needed for cough. 20 capsule Eustace Moore, MD      PDMP not reviewed this encounter.   Eustace Moore, MD 02/08/21 1320

## 2021-02-08 NOTE — ED Triage Notes (Signed)
Patient states that she has a non-productive cough, congestion, body aches since Wednesday.  Patient did a home COVID test which was negative.  Patient is vaccinated for COVID.

## 2021-02-10 LAB — COVID-19, FLU A+B AND RSV
Influenza A, NAA: NOT DETECTED
Influenza B, NAA: NOT DETECTED
RSV, NAA: NOT DETECTED
SARS-CoV-2, NAA: NOT DETECTED

## 2021-07-29 ENCOUNTER — Other Ambulatory Visit (HOSPITAL_BASED_OUTPATIENT_CLINIC_OR_DEPARTMENT_OTHER): Payer: Self-pay | Admitting: Internal Medicine

## 2021-07-29 DIAGNOSIS — Z1231 Encounter for screening mammogram for malignant neoplasm of breast: Secondary | ICD-10-CM

## 2021-08-19 ENCOUNTER — Other Ambulatory Visit: Payer: Self-pay | Admitting: Internal Medicine

## 2021-08-19 DIAGNOSIS — Z1231 Encounter for screening mammogram for malignant neoplasm of breast: Secondary | ICD-10-CM

## 2021-10-13 ENCOUNTER — Other Ambulatory Visit: Payer: Self-pay | Admitting: Internal Medicine

## 2021-10-13 DIAGNOSIS — Z13820 Encounter for screening for osteoporosis: Secondary | ICD-10-CM

## 2021-12-08 ENCOUNTER — Ambulatory Visit (HOSPITAL_BASED_OUTPATIENT_CLINIC_OR_DEPARTMENT_OTHER): Payer: TRICARE For Life (TFL)

## 2021-12-08 ENCOUNTER — Other Ambulatory Visit (HOSPITAL_BASED_OUTPATIENT_CLINIC_OR_DEPARTMENT_OTHER): Payer: TRICARE For Life (TFL)

## 2021-12-15 ENCOUNTER — Ambulatory Visit (HOSPITAL_BASED_OUTPATIENT_CLINIC_OR_DEPARTMENT_OTHER)
Admission: RE | Admit: 2021-12-15 | Discharge: 2021-12-15 | Disposition: A | Discharge status: Home / Self Care | Payer: Medicare Other | Source: Ambulatory Visit | Attending: Internal Medicine | Admitting: Internal Medicine

## 2021-12-15 ENCOUNTER — Encounter (HOSPITAL_BASED_OUTPATIENT_CLINIC_OR_DEPARTMENT_OTHER): Payer: Self-pay

## 2021-12-15 DIAGNOSIS — M8580 Other specified disorders of bone density and structure, unspecified site: Secondary | ICD-10-CM | POA: Insufficient documentation

## 2021-12-15 DIAGNOSIS — Z9882 Breast implant status: Secondary | ICD-10-CM | POA: Diagnosis not present

## 2021-12-15 DIAGNOSIS — Z1231 Encounter for screening mammogram for malignant neoplasm of breast: Secondary | ICD-10-CM | POA: Insufficient documentation

## 2021-12-15 DIAGNOSIS — E2839 Other primary ovarian failure: Secondary | ICD-10-CM | POA: Diagnosis not present

## 2021-12-15 DIAGNOSIS — Z853 Personal history of malignant neoplasm of breast: Secondary | ICD-10-CM | POA: Diagnosis not present

## 2021-12-15 DIAGNOSIS — Z13820 Encounter for screening for osteoporosis: Secondary | ICD-10-CM

## 2021-12-15 DIAGNOSIS — M8589 Other specified disorders of bone density and structure, multiple sites: Secondary | ICD-10-CM | POA: Diagnosis not present

## 2021-12-15 DIAGNOSIS — Z780 Asymptomatic menopausal state: Secondary | ICD-10-CM | POA: Insufficient documentation

## 2023-02-14 ENCOUNTER — Encounter (HOSPITAL_BASED_OUTPATIENT_CLINIC_OR_DEPARTMENT_OTHER): Payer: Self-pay | Admitting: Internal Medicine

## 2023-03-11 ENCOUNTER — Encounter (HOSPITAL_BASED_OUTPATIENT_CLINIC_OR_DEPARTMENT_OTHER): Payer: Self-pay | Admitting: Internal Medicine

## 2023-03-14 ENCOUNTER — Other Ambulatory Visit (HOSPITAL_BASED_OUTPATIENT_CLINIC_OR_DEPARTMENT_OTHER): Payer: Self-pay | Admitting: Internal Medicine

## 2023-03-14 DIAGNOSIS — Z1231 Encounter for screening mammogram for malignant neoplasm of breast: Secondary | ICD-10-CM

## 2023-04-06 ENCOUNTER — Encounter (HOSPITAL_BASED_OUTPATIENT_CLINIC_OR_DEPARTMENT_OTHER): Payer: Self-pay

## 2023-04-06 ENCOUNTER — Ambulatory Visit (HOSPITAL_BASED_OUTPATIENT_CLINIC_OR_DEPARTMENT_OTHER)
Admission: RE | Admit: 2023-04-06 | Discharge: 2023-04-06 | Disposition: A | Discharge status: Home / Self Care | Payer: Medicare Other | Source: Ambulatory Visit | Attending: Internal Medicine | Admitting: Internal Medicine

## 2023-04-06 DIAGNOSIS — Z1231 Encounter for screening mammogram for malignant neoplasm of breast: Secondary | ICD-10-CM | POA: Insufficient documentation

## 2024-04-21 ENCOUNTER — Other Ambulatory Visit (HOSPITAL_BASED_OUTPATIENT_CLINIC_OR_DEPARTMENT_OTHER): Payer: Self-pay | Admitting: Internal Medicine

## 2024-04-21 DIAGNOSIS — Z1231 Encounter for screening mammogram for malignant neoplasm of breast: Secondary | ICD-10-CM

## 2024-04-24 ENCOUNTER — Ambulatory Visit (HOSPITAL_BASED_OUTPATIENT_CLINIC_OR_DEPARTMENT_OTHER)
Admission: RE | Admit: 2024-04-24 | Discharge: 2024-04-24 | Disposition: A | Discharge status: Home / Self Care | Source: Ambulatory Visit | Attending: Internal Medicine | Admitting: Internal Medicine

## 2024-04-24 ENCOUNTER — Encounter (HOSPITAL_BASED_OUTPATIENT_CLINIC_OR_DEPARTMENT_OTHER): Payer: Self-pay

## 2024-04-24 DIAGNOSIS — Z1231 Encounter for screening mammogram for malignant neoplasm of breast: Secondary | ICD-10-CM | POA: Diagnosis present
